# Patient Record
Sex: Male | Born: 1955 | Race: Black or African American | Hispanic: No | Marital: Married | Smoking: Former smoker
Health system: Southern US, Community
[De-identification: ages and names within clinical notes are randomized; demographics above are authoritative.]

## PROBLEM LIST (undated history)

## (undated) DIAGNOSIS — M940 Chondrocostal junction syndrome [Tietze]: Secondary | ICD-10-CM

## (undated) DIAGNOSIS — D126 Benign neoplasm of colon, unspecified: Secondary | ICD-10-CM

## (undated) DIAGNOSIS — K219 Gastro-esophageal reflux disease without esophagitis: Secondary | ICD-10-CM

## (undated) DIAGNOSIS — I10 Essential (primary) hypertension: Secondary | ICD-10-CM

## (undated) HISTORY — DX: Benign neoplasm of colon, unspecified: D12.6

## (undated) HISTORY — DX: Chondrocostal junction syndrome (tietze): M94.0

---
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
## 1998-06-23 DIAGNOSIS — I10 Essential (primary) hypertension: Secondary | ICD-10-CM | POA: Insufficient documentation

## 2003-06-24 HISTORY — PX: TOE SURGERY: SHX1073

## 2006-03-12 DIAGNOSIS — J301 Allergic rhinitis due to pollen: Secondary | ICD-10-CM | POA: Insufficient documentation

## 2006-11-11 ENCOUNTER — Ambulatory Visit: Payer: Self-pay | Admitting: Gastroenterology

## 2006-11-11 LAB — HM COLONOSCOPY: HM COLON: NORMAL

## 2009-05-29 ENCOUNTER — Ambulatory Visit: Payer: Self-pay | Admitting: Urology

## 2010-10-02 HISTORY — PX: OTHER SURGICAL HISTORY: SHX169

## 2011-04-11 DIAGNOSIS — E291 Testicular hypofunction: Secondary | ICD-10-CM | POA: Insufficient documentation

## 2012-05-27 ENCOUNTER — Ambulatory Visit: Payer: Self-pay | Admitting: Family Medicine

## 2012-07-26 ENCOUNTER — Other Ambulatory Visit: Payer: Self-pay | Admitting: Family Medicine

## 2012-07-26 ENCOUNTER — Ambulatory Visit: Payer: Self-pay | Admitting: Family Medicine

## 2012-07-26 LAB — CBC WITH DIFFERENTIAL/PLATELET
Basophil #: 0.1 x10 3/mm 3
Basophil %: 1 %
Eosinophil #: 0.4 x10 3/mm 3
Eosinophil %: 5.3 %
HCT: 47.2 %
HGB: 16.2 g/dL
Lymphocyte %: 24.4 %
Lymphs Abs: 2 x10 3/mm 3
MCH: 27.9 pg
MCHC: 34.4 g/dL
MCV: 81 fL
Monocyte #: 0.4 "x10 3/mm "
Monocyte %: 4.7 %
Neutrophil #: 5.4 x10 3/mm 3
Neutrophil %: 64.6 %
Platelet: 213 x10 3/mm 3
RBC: 5.81 x10 6/mm 3
RDW: 13.4 %
WBC: 8.3 x10 3/mm 3

## 2012-07-26 LAB — COMPREHENSIVE METABOLIC PANEL WITH GFR
Albumin: 4.2 g/dL
Alkaline Phosphatase: 105 U/L
Anion Gap: 3 — ABNORMAL LOW
BUN: 13 mg/dL
Bilirubin,Total: 0.4 mg/dL
Calcium, Total: 9.4 mg/dL
Chloride: 105 mmol/L
Co2: 31 mmol/L
Creatinine: 1.12 mg/dL
EGFR (African American): >60
EGFR (Non-African Amer.): >60
Glucose: 107 mg/dL — ABNORMAL HIGH
Osmolality: 278
Potassium: 4.1 mmol/L
SGOT(AST): 25 U/L
SGPT (ALT): 30 U/L
Sodium: 139 mmol/L
Total Protein: 8 g/dL

## 2012-07-26 LAB — TROPONIN I: Troponin-I: <0.02 ng/mL

## 2014-02-09 ENCOUNTER — Ambulatory Visit: Payer: Self-pay | Admitting: Family Medicine

## 2014-10-16 LAB — BASIC METABOLIC PANEL
BUN: 13 mg/dL (ref 4–21)
Creatinine: 1.2 mg/dL (ref 0.6–1.3)
Glucose: 103 mg/dL
Potassium: 4.4 mmol/L (ref 3.4–5.3)
Sodium: 140 mmol/L (ref 137–147)

## 2014-10-16 LAB — HEPATIC FUNCTION PANEL: ALT: 17 U/L (ref 10–40)

## 2014-10-16 LAB — LIPID PANEL
Cholesterol: 189 mg/dL (ref 0–200)
HDL: 50 mg/dL (ref 35–70)
LDL CALC: 112 mg/dL
Triglycerides: 135 mg/dL (ref 40–160)

## 2014-10-16 LAB — PSA: PSA: 0.9

## 2014-12-29 ENCOUNTER — Other Ambulatory Visit: Payer: Self-pay | Admitting: *Deleted

## 2014-12-29 MED ORDER — ROSUVASTATIN CALCIUM 10 MG PO TABS: 10.0000 mg | ORAL_TABLET | Freq: Every day | ORAL | Status: DC

## 2014-12-29 NOTE — Telephone Encounter (Signed)
Refill request for Crestor 10 mg Last filled by MD on- 01/02/2014 #30 x12 Last Appt: 10/16/2014 Next Appt: none Please advise refill?

## 2015-01-23 ENCOUNTER — Other Ambulatory Visit: Payer: Self-pay | Admitting: *Deleted

## 2015-01-23 MED ORDER — BENAZEPRIL-HYDROCHLOROTHIAZIDE 20-12.5 MG PO TABS: 1.0000 | ORAL_TABLET | Freq: Every day | ORAL | Status: DC

## 2015-01-23 NOTE — Telephone Encounter (Signed)
Refill request for Benazepril-HCTZ 20-12.5 mg Last filled by MD on- 12/26/2013 #30 x12 Last Appt: 10/16/2014 Next Appt: none Please advise refill?

## 2015-02-05 ENCOUNTER — Ambulatory Visit (INDEPENDENT_AMBULATORY_CARE_PROVIDER_SITE_OTHER): Payer: BC Managed Care – PPO | Admitting: Podiatry

## 2015-02-05 ENCOUNTER — Encounter: Payer: Self-pay | Admitting: Podiatry

## 2015-02-05 VITALS — BP 121/70 | HR 80 | Resp 16 | Ht 71.0 in | Wt 230.0 lb

## 2015-02-05 DIAGNOSIS — L600 Ingrowing nail: Secondary | ICD-10-CM | POA: Diagnosis not present

## 2015-02-05 MED ORDER — NEOMYCIN-POLYMYXIN-HC 3.5-10000-1 OT SOLN: OTIC | Status: DC

## 2015-02-05 NOTE — Patient Instructions (Signed)

## 2015-02-05 NOTE — Progress Notes (Signed)
   Subjective:    Patient ID: Keith Villegas, male    DOB: [birth year], 59 y.o.   MRN: [id number]  HPI: He presents today with a 2 week duration of a painful ingrown tibial border of the hallux right. He states that he has tried trimming the edges out himself but fails to completely remove the ingrown. He states that he's been also using peroxide to help with any infection. He states the pain is horrible with shoe gear and he must continue to work on a regular basis. He denies fever chills nausea vomiting muscle aches pains no constitutional signs of infection.  Review of Systems  All other systems reviewed and are negative.      Objective:   Physical Exam: I have reviewed his past medical history medications allergies social history and review of systems with chief complaint. Vital signs are stable he is alert and oriented 3 appears to be well-nourished and able to ambulate without assistance. Pulses are strongly palpable bilateral. Capillary fill time to digits 1 through 5 his bilateral. Neurologic sensorium is intact per Semmes-Weinstein monofilament. Deep tendon reflexes are intact and brisk bilateral. Muscle strength +5 over 5 dorsiflexion plantar flexors and inverters everters all intrinsic musculature is intact. Orthopedic evaluation demonstrates all joints distal to the ankle full range of motion without crepitation. Cutaneous evaluation demonstrates supple well-hydrated cutis with exception of a sharp incurvated nail margin to the tibial border of the hallux nail plate right foot. This does demonstrate erythema and edema purulence and malodor.        Assessment & Plan:  Assessment: 59 year old white male with a painful ingrown nail tibial border hallux right mildly complicated.  Plan: Discussed etiology pathology conservative versus surgical therapies. We discussed appropriate shoe gear stretching exercises ice therapy issue modifications. He was given both oral and written home-going  instructions for the care and soaking of his toe after surgery. Minor nail surgery was performed today after local anesthesia was administered. A chemical matrixectomy was performed of causing the tibial border of the nail from the toe exposing the nail bed and the matrix. 3 applications of phenol were applied. Silvadene cream and Telfa pad and a dry sterile compressive resting was applied. He was provided a prescription for Cortisporin otic which will be applied twice daily after soaking. This was sent to his pharmacy electronically. I will follow-up with him in 1 week.

## 2015-02-12 ENCOUNTER — Ambulatory Visit (INDEPENDENT_AMBULATORY_CARE_PROVIDER_SITE_OTHER): Payer: BC Managed Care – PPO | Admitting: Podiatry

## 2015-02-12 ENCOUNTER — Encounter: Payer: Self-pay | Admitting: Podiatry

## 2015-02-12 DIAGNOSIS — L600 Ingrowing nail: Secondary | ICD-10-CM

## 2015-02-12 NOTE — Progress Notes (Signed)
He presents today 1 week status post matrixectomy hallux right. He states that he continues to soak on a regular basis. He has no problems with. He denies fever chills nausea vomiting muscle aches and pains.  Objective: Vital signs are stable alert and oriented 3. Hallux nail right matrixectomy site appears to be healing very nicely uneventfully. No signs of infection.  Assessment: Well-healing surgical toe right.  Plan: Discontinue Betadine spell with Epsom salts and warm water soaks continue to separate during the day and leave open at night. Continue Cortisporin Otic twice daily.  Roselind Messier DPM

## 2015-02-12 NOTE — Patient Instructions (Signed)

## 2015-08-24 ENCOUNTER — Other Ambulatory Visit: Payer: Self-pay | Admitting: *Deleted

## 2015-08-24 MED ORDER — OMEPRAZOLE 40 MG PO CPDR: 40.0000 mg | DELAYED_RELEASE_CAPSULE | Freq: Every day | ORAL | Status: DC

## 2015-09-12 ENCOUNTER — Encounter: Payer: Self-pay | Admitting: Family Medicine

## 2015-09-12 ENCOUNTER — Ambulatory Visit (INDEPENDENT_AMBULATORY_CARE_PROVIDER_SITE_OTHER): Payer: BC Managed Care – PPO | Admitting: Family Medicine

## 2015-09-12 VITALS — BP 130/72 | HR 72 | Temp 98.3°F | Resp 16 | Ht 70.0 in | Wt 247.0 lb

## 2015-09-12 DIAGNOSIS — L918 Other hypertrophic disorders of the skin: Secondary | ICD-10-CM | POA: Insufficient documentation

## 2015-09-12 DIAGNOSIS — M940 Chondrocostal junction syndrome [Tietze]: Secondary | ICD-10-CM

## 2015-09-12 DIAGNOSIS — M545 Low back pain, unspecified: Secondary | ICD-10-CM | POA: Insufficient documentation

## 2015-09-12 DIAGNOSIS — E669 Obesity, unspecified: Secondary | ICD-10-CM | POA: Insufficient documentation

## 2015-09-12 DIAGNOSIS — K219 Gastro-esophageal reflux disease without esophagitis: Secondary | ICD-10-CM | POA: Insufficient documentation

## 2015-09-12 DIAGNOSIS — N451 Epididymitis: Secondary | ICD-10-CM

## 2015-09-12 DIAGNOSIS — N50812 Left testicular pain: Secondary | ICD-10-CM | POA: Diagnosis not present

## 2015-09-12 HISTORY — DX: Chondrocostal junction syndrome (tietze): M94.0

## 2015-09-12 MED ORDER — DOXYCYCLINE HYCLATE 100 MG PO TABS: 100.0000 mg | ORAL_TABLET | Freq: Two times a day (BID) | ORAL | Status: DC

## 2015-09-12 NOTE — Progress Notes (Signed)
Patient: Keith Villegas Male    DOB: [birth year]   60 y.o.   MRN: [id number] Visit Date: 09/12/2015  Today's Provider: Lelon Huh, MD   Chief Complaint  Patient presents with  . Groin Pain    x 2 weeks   Subjective:    Groin Pain The patient's primary symptoms include testicular pain. The patient's pertinent negatives include no genital injury, genital itching, genital lesions, penile pain or scrotal swelling. Primary symptoms comment: left inguinal pain. This is a new problem. Episode onset: 2 weeks ago. The problem occurs intermittently. The problem has been gradually worsening. Pain severity now: mediiun to severe. Associated symptoms include frequency (unchanged for several years; patient states he drinks lots of water). Pertinent negatives include no abdominal pain, anorexia, chest pain, chills, constipation, coughing, diarrhea, discolored urine, dysuria, fever, flank pain, headaches, hematuria, hesitancy, joint pain, joint swelling, nausea, rash, shortness of breath, sore throat, urgency, urinary retention or vomiting.  Patient is sometimes aggravated by walking. However it is always present even when at rest and lying in bed. He states it kept him last night and he had difficulty sleeping.  Pain sometimes radiates down to his left upper thigh.      No Known Allergies Previous Medications   ASPIRIN 81 MG TABLET    Take by mouth.   BENAZEPRIL-HYDROCHLORTHIAZIDE (LOTENSIN HCT) 20-12.5 MG PER TABLET    Take 1 tablet by mouth daily.   METOPROLOL SUCCINATE (TOPROL-XL) 50 MG 24 HR TABLET    Take by mouth.   NEOMYCIN-POLYMYXIN-HYDROCORTISONE (CORTISPORIN) OTIC SOLUTION    Apply one to two drops to toe after soaking twice daily.   OMEPRAZOLE (PRILOSEC) 40 MG CAPSULE    Take 1 capsule (40 mg total) by mouth daily.   ROSUVASTATIN (CRESTOR) 10 MG TABLET    Take 1 tablet (10 mg total) by mouth daily.   TRAMADOL (ULTRAM) 50 MG TABLET    Take by mouth.    Review of Systems    Constitutional: Negative for fever and chills.  HENT: Negative for sore throat.   Respiratory: Negative for cough and shortness of breath.   Cardiovascular: Negative for chest pain.  Gastrointestinal: Negative for nausea, vomiting, abdominal pain, diarrhea, constipation and anorexia.  Genitourinary: Positive for frequency (unchanged for several years; patient states he drinks lots of water) and testicular pain. Negative for dysuria, hesitancy, urgency, flank pain, scrotal swelling and penile pain.       Left inguinal pain  Musculoskeletal: Negative for joint pain.  Skin: Negative for rash.  Neurological: Negative for headaches.    Social History  Substance Use Topics  . Smoking status: Never Smoker   . Smokeless tobacco: Not on file  . Alcohol Use: No   Objective:   BP 130/72 mmHg  Pulse 72  Temp(Src) 98.3 F (36.8 C) (Oral)  Resp 16  Ht 5\' 10"  (1.778 m)  Wt 247 lb (112.038 kg)  BMI 35.44 kg/m2  SpO2 97%  Physical Exam  GU: Small bilateral inguinal hernias. Tender posterior aspect of left testicle. Slightly swollen. No other lesions.     Assessment & Plan:     1. Testicular pain, left He does have small inguinal hernias, but I think his current pain is due to epididymitis as below.   2. Epididymitis  - doxycycline (VIBRA-TABS) 100 MG tablet; Take 1 tablet (100 mg total) by mouth 2 (two) times daily.  Dispense: 28 tablet; Refill: 0  He is to call if pain worsens  or not significantly improved in 4-5 days.      Lelon Huh, MD  [facility]

## 2015-09-21 ENCOUNTER — Telehealth: Payer: Self-pay | Admitting: Family Medicine

## 2015-09-21 ENCOUNTER — Other Ambulatory Visit: Payer: Self-pay | Admitting: *Deleted

## 2015-09-21 DIAGNOSIS — N451 Epididymitis: Secondary | ICD-10-CM

## 2015-09-21 MED ORDER — METOPROLOL SUCCINATE ER 50 MG PO TB24: 50.0000 mg | ORAL_TABLET | Freq: Every day | ORAL | Status: DC

## 2015-09-21 MED ORDER — DOXYCYCLINE HYCLATE 100 MG PO TABS
100.0000 mg | ORAL_TABLET | Freq: Two times a day (BID) | ORAL | Status: AC
Start: 2015-09-21 — End: 2015-10-05

## 2015-09-21 NOTE — Telephone Encounter (Signed)
Patient called office with concerns because he had lost his prescription for Doxycyline 100mg  that was prescribed on 09/12/15. Patient is requesting that provider send in prescription for 14 days? Which he states is how many days he had left to take prescription. Please Advise.  [facility]

## 2015-09-21 NOTE — Telephone Encounter (Signed)
Please review. Thanks!  

## 2015-10-19 ENCOUNTER — Encounter: Payer: Self-pay | Admitting: Family Medicine

## 2015-10-19 ENCOUNTER — Ambulatory Visit (INDEPENDENT_AMBULATORY_CARE_PROVIDER_SITE_OTHER): Payer: BC Managed Care – PPO | Admitting: Family Medicine

## 2015-10-19 VITALS — BP 124/68 | HR 74 | Temp 97.5°F | Resp 16 | Ht 69.0 in | Wt 243.0 lb

## 2015-10-19 DIAGNOSIS — Z0000 Encounter for general adult medical examination without abnormal findings: Secondary | ICD-10-CM | POA: Diagnosis not present

## 2015-10-19 DIAGNOSIS — E785 Hyperlipidemia, unspecified: Secondary | ICD-10-CM | POA: Diagnosis not present

## 2015-10-19 DIAGNOSIS — I10 Essential (primary) hypertension: Secondary | ICD-10-CM | POA: Diagnosis not present

## 2015-10-19 NOTE — Progress Notes (Signed)
Patient ID: Keith Villegas, male   DOB: [birth year], 60 y.o.   MRN: [id number]       Patient: Keith Villegas, Male    DOB: 04/22/1956, 60 y.o.   MRN: [id number] Visit Date: 10/19/2015  Today's Provider: Lelon Huh, MD   Chief Complaint  Patient presents with  . Annual Exam   Subjective:    Annual physical exam Keith Villegas is a 60 y.o. male who presents today for health maintenance and complete physical. He feels fairly well. He reports he is not formally exercising but has an active job. He reports he is sleeping fairly well.  -----------------------------------------------------------------  Hypertension, follow-up:  BP Readings from Last 3 Encounters:  09/12/15 130/72  02/05/15 121/70    He was last seen for hypertension 1 years ago.  BP at that visit was 140/74. Management since that visit includes none. He reports fair compliance with treatment. He is not having side effects.  He is not exercising. He is adherent to low salt diet.   Outside blood pressures are not being checked in a while. He is experiencing none.  Patient denies chest pain, chest pressure/discomfort, claudication, dyspnea, exertional chest pressure/discomfort, fatigue, irregular heart beat, lower extremity edema, near-syncope, orthopnea and palpitations.   Cardiovascular risk factors include dyslipidemia, hypertension, male gender and obesity (BMI >= 30 kg/m2).   Wt Readings from Last 3 Encounters:  09/12/15 247 lb (112.038 kg)  02/05/15 230 lb (104.327 kg)   ------------------------------------------------------------------------    Lipid/Cholesterol, Follow-up:   Last seen for this1 years ago.  Management changes since that visit include none. . Last Lipid Panel:    Component Value Date/Time   CHOL 189 10/16/2014   TRIG 135 10/16/2014   HDL 50 10/16/2014   LDLCALC 112 10/16/2014    He reports good compliance with treatment. He is not having side effects.  Current symptoms include  none and have been unchanged. Weight trend: has lost 4 pounds since LOV.  Wt Readings from Last 3 Encounters:  09/12/15 247 lb (112.038 kg)  02/05/15 230 lb (104.327 kg)    -------------------------------------------------------------------   GERD, Follow up:  The patient was last seen for GERD 1 years ago. Changes made since that visit include none.  He reports good compliance with treatment. He is not having side effects.  He is NOT experiencing abdominal bloating, belching, cough, dysphagia, heartburn or nausea  ------------------------------------------------------------------------   Review of Systems  Constitutional: Negative.   HENT: Negative.   Eyes: Negative.   Respiratory: Negative.   Cardiovascular: Negative.   Gastrointestinal: Negative.   Endocrine: Negative.   Genitourinary: Negative.   Musculoskeletal: Positive for back pain.  Skin: Negative.   Allergic/Immunologic: Negative.   Neurological: Negative.   Hematological: Negative.   Psychiatric/Behavioral: Negative.     Social History      He  reports that he has never smoked. He does not have any smokeless tobacco history on file. He reports that he does not drink alcohol or use illicit drugs.       Social History   Social History  . Marital Status: Married    Spouse Name: N/A  . Number of Children: 2  . Years of Education: N/A   Occupational History  . Crew leader/ Housekeeping Tech     works at [employer]  . Smoking status: Never Smoker   . Smokeless tobacco: None  . Alcohol Use: No  . Drug Use: No  . Sexual Activity: Not Asked  Other Topics Concern  . None   Social History Narrative    Past Medical History  Diagnosis Date  . Costochondritis 09/12/2015     Patient Active Problem List   Diagnosis Date Noted  . Obesity 09/12/2015  . GERD (gastroesophageal reflux disease) 09/12/2015  . Hypogonadism, male 04/11/2011  . Allergic rhinitis due to pollen  03/12/2006  . Hyperlipidemia 06/23/1998  . Essential (primary) hypertension 06/23/1998    Past Surgical History  Procedure Laterality Date  . Toe surgery  2005  . Appendectomy  1976  . Echocardiogram  10/02/2010  . Myocardial perfusion scan  10/02/2010    normal perfusion. Normal systolic function. Borderline right ventricular dilation. EF>65%    Family History        Family Status  Relation Status Death Age  . Mother Alive   . Father Alive     78  . Sister Alive   . Brother Alive   . Brother Alive         His family history includes CAD in his father; Cancer in his mother.    No Known Allergies  Previous Medications   ASPIRIN 81 MG TABLET    Take 81 mg by mouth daily.    BENAZEPRIL-HYDROCHLORTHIAZIDE (LOTENSIN HCT) 20-12.5 MG PER TABLET    Take 1 tablet by mouth daily.   METOPROLOL SUCCINATE (TOPROL-XL) 50 MG 24 HR TABLET    Take 1 tablet (50 mg total) by mouth daily.   NEOMYCIN-POLYMYXIN-HYDROCORTISONE (CORTISPORIN) OTIC SOLUTION    Apply one to two drops to toe after soaking twice daily.   OMEPRAZOLE (PRILOSEC) 40 MG CAPSULE    Take 1 capsule (40 mg total) by mouth daily.   ROSUVASTATIN (CRESTOR) 10 MG TABLET    Take 1 tablet (10 mg total) by mouth daily.   TRAMADOL (ULTRAM) 50 MG TABLET    Take by mouth. 1-2 tablets every 6 hours as needed for pain    Patient Care Team: Birdie Sons, MD as PCP - General (Family Medicine) [place], DPM as Consulting Physician (Podiatry) Provider Not In System     Objective:   Vitals: BP 124/68 mmHg  Pulse 74  Temp(Src) 97.5 F (36.4 C) (Oral)  Resp 16  Ht 5\' 9"  (1.753 m)  Wt 243 lb (110.224 kg)  BMI 35.87 kg/m2   Physical Exam   General Appearance:    Alert, cooperative, no distress, appears stated age  Head:    Normocephalic, without obvious abnormality, atraumatic  Eyes:    PERRL, conjunctiva/corneas clear, EOM's intact, fundi    benign, both eyes       Ears:    Normal TM's and external ear canals, both ears    Nose:   Nares normal, septum midline, mucosa normal, no drainage   or sinus tenderness  Throat:   Lips, mucosa, and tongue normal; teeth and gums normal  Neck:   Supple, symmetrical, trachea midline, no adenopathy;       thyroid:  No enlargement/tenderness/nodules; no carotid   bruit or JVD  Back:     Symmetric, no curvature, ROM normal, no CVA tenderness  Lungs:     Clear to auscultation bilaterally, respirations unlabored  Chest wall:    No tenderness or deformity  Heart:    Regular rate and rhythm, S1 and S2 normal, no murmur, rub   or gallop  Abdomen:     Soft, non-tender, bowel sounds active all four quadrants,    no masses, no organomegaly  Genitalia:  deferred  Rectal:    deferred  Extremities:   Extremities normal, atraumatic, no cyanosis or edema  Pulses:   2+ and symmetric all extremities  Skin:   Skin color, texture, turgor normal, no rashes or lesions  Lymph nodes:   Cervical, supraclavicular, and axillary nodes normal  Neurologic:   CNII-XII intact. Normal strength, sensation and reflexes      throughout    Depression Screen PHQ 2/9 Scores 10/19/2015  PHQ - 2 Score 0      Assessment & Plan:     Routine Health Maintenance and Physical Exam  Exercise Activities and Dietary recommendations Goals    None      Immunization History  Administered Date(s) Administered  . Tdap 09/21/2007    Health Maintenance  Topic Date Due  . HIV Screening  11/08/1971  . INFLUENZA VACCINE  01/22/2016  . COLONOSCOPY  11/10/2016  . TETANUS/TDAP  09/20/2017  . Hepatitis C Screening  Completed      Discussed health benefits of physical activity, and encouraged him to engage in regular exercise appropriate for his age and condition.    --------------------------------------------------------------------  1. Annual physical exam  - Lipid panel - Comprehensive metabolic panel - PSA  2. Essential hypertension Well controlled.  Continue current medications.   - EKG  12-Lead  3. Hyperlipidemia He is tolerating rosuvastatin well with no adverse effects.   - Lipid panel

## 2015-10-20 LAB — LIPID PANEL
CHOL/HDL RATIO: 4.1 ratio (ref 0.0–5.0)
Cholesterol, Total: 190 mg/dL (ref 100–199)
HDL: 46 mg/dL (ref >39)
LDL Calculated: 105 mg/dL — ABNORMAL HIGH (ref 0–99)
TRIGLYCERIDES: 197 mg/dL — AB (ref 0–149)
VLDL Cholesterol Cal: 39 mg/dL (ref 5–40)

## 2015-10-20 LAB — COMPREHENSIVE METABOLIC PANEL
ALBUMIN: 4.6 g/dL (ref 3.5–5.5)
ALT: 21 IU/L (ref 0–44)
AST: 18 IU/L (ref 0–40)
Albumin/Globulin Ratio: 1.8 (ref 1.2–2.2)
Alkaline Phosphatase: 93 IU/L (ref 39–117)
BUN / CREAT RATIO: 11 (ref 9–20)
BUN: 12 mg/dL (ref 6–24)
Bilirubin Total: 0.7 mg/dL (ref 0.0–1.2)
CALCIUM: 9.7 mg/dL (ref 8.7–10.2)
CO2: 26 mmol/L (ref 18–29)
Chloride: 97 mmol/L (ref 96–106)
Creatinine, Ser: 1.08 mg/dL (ref 0.76–1.27)
GFR, EST AFRICAN AMERICAN: 87 mL/min/{1.73_m2} (ref >59)
GFR, EST NON AFRICAN AMERICAN: 75 mL/min/{1.73_m2} (ref >59)
GLUCOSE: 102 mg/dL — AB (ref 65–99)
Globulin, Total: 2.6 g/dL (ref 1.5–4.5)
Potassium: 4.1 mmol/L (ref 3.5–5.2)
Sodium: 139 mmol/L (ref 134–144)
TOTAL PROTEIN: 7.2 g/dL (ref 6.0–8.5)

## 2015-10-20 LAB — PSA: Prostate Specific Ag, Serum: 0.2 ng/mL (ref 0.0–4.0)

## 2015-10-22 ENCOUNTER — Telehealth: Payer: Self-pay | Admitting: Family Medicine

## 2015-10-22 NOTE — Telephone Encounter (Signed)
Pt called to get labs and EKG results. Pt stated his phone is messed up and will try to call back later. Thanks TNP

## 2015-10-22 NOTE — Telephone Encounter (Signed)
Noted. Sharyn Lull (Dr. Caryn Section CMA) has tried calling patient this morning. Will let her know that patient will call back.

## 2015-11-26 ENCOUNTER — Other Ambulatory Visit: Payer: Self-pay | Admitting: Family Medicine

## 2015-11-26 MED ORDER — METOPROLOL SUCCINATE ER 50 MG PO TB24: 50.0000 mg | ORAL_TABLET | Freq: Every day | ORAL | Status: DC

## 2015-12-21 ENCOUNTER — Other Ambulatory Visit: Payer: Self-pay | Admitting: *Deleted

## 2015-12-21 DIAGNOSIS — K219 Gastro-esophageal reflux disease without esophagitis: Secondary | ICD-10-CM

## 2015-12-21 MED ORDER — OMEPRAZOLE 40 MG PO CPDR: 40.0000 mg | DELAYED_RELEASE_CAPSULE | Freq: Every day | ORAL | Status: DC

## 2015-12-21 NOTE — Telephone Encounter (Signed)
Refill request for omeprazole 40 mg cap qd Last filled by MD on- 08/24/2015 #30 x3 Last Appt: 10/19/2015 Next Appt: none Please advise refill?

## 2015-12-22 MED ORDER — OMEPRAZOLE 40 MG PO CPDR: 40.0000 mg | DELAYED_RELEASE_CAPSULE | Freq: Every day | ORAL | Status: DC

## 2015-12-22 NOTE — Addendum Note (Signed)
Addended by: Mar Daring on: 12/22/2015 09:22 AM   Modules accepted: Orders

## 2016-01-24 ENCOUNTER — Ambulatory Visit (INDEPENDENT_AMBULATORY_CARE_PROVIDER_SITE_OTHER): Payer: BC Managed Care – PPO | Admitting: Family Medicine

## 2016-01-24 ENCOUNTER — Encounter: Payer: Self-pay | Admitting: Family Medicine

## 2016-01-24 VITALS — BP 120/80 | HR 88 | Temp 98.4°F | Resp 16 | Wt 240.4 lb

## 2016-01-24 DIAGNOSIS — J069 Acute upper respiratory infection, unspecified: Secondary | ICD-10-CM | POA: Diagnosis not present

## 2016-01-24 MED ORDER — HYDROCODONE-HOMATROPINE 5-1.5 MG/5ML PO SYRP
ORAL_SOLUTION | ORAL | 0 refills | Status: DC
Start: 2016-01-24 — End: 2016-10-21

## 2016-01-24 NOTE — Patient Instructions (Signed)
Discussed use of Mucinex D for congestion and Delsym for cough. 

## 2016-01-24 NOTE — Progress Notes (Signed)
Subjective:     Patient ID: Keith Villegas, male   DOB: Dec 29, 1955, 60 y.o.   MRN: [id number]  HPI  Chief Complaint  Patient presents with  . Cough    Patient comes in office today with concerns of cough and congestion for the past five days. Patient reports symptoms of; bilateral ear pain, sinus pain below eyes and sinus pressure over his entire head.   States he took Coricidin last night for his symptoms.   Review of Systems     Objective:   Physical Exam  Constitutional: He appears well-developed and well-nourished. No distress.  Ears: T.M's obscured by cerumen Throat: no tonsillar enlargement or exudate Neck: no cervical adenopathy Lungs: clear     Assessment:    1. Upper respiratory infection - HYDROcodone-homatropine (HYCODAN) 5-1.5 MG/5ML syrup; 5 ml 4-6 hours as needed for cough  Dispense: 240 mL; Refill: 0    Plan:    Discussed use of Mucinex D and Delsym.

## 2016-01-25 ENCOUNTER — Other Ambulatory Visit: Payer: Self-pay | Admitting: *Deleted

## 2016-01-25 MED ORDER — ROSUVASTATIN CALCIUM 10 MG PO TABS: 10.0000 mg | ORAL_TABLET | Freq: Every day | ORAL | 12 refills | Status: DC

## 2016-01-25 MED ORDER — BENAZEPRIL-HYDROCHLOROTHIAZIDE 20-12.5 MG PO TABS: 1.0000 | ORAL_TABLET | Freq: Every day | ORAL | 12 refills | Status: DC

## 2016-03-25 ENCOUNTER — Other Ambulatory Visit: Payer: Self-pay

## 2016-03-25 MED ORDER — METOPROLOL SUCCINATE ER 50 MG PO TB24: 50.0000 mg | ORAL_TABLET | Freq: Every day | ORAL | 5 refills | Status: DC

## 2016-03-25 NOTE — Telephone Encounter (Signed)
Pharmacy request refill. 

## 2016-04-25 ENCOUNTER — Other Ambulatory Visit: Payer: Self-pay | Admitting: Physician Assistant

## 2016-04-25 DIAGNOSIS — K219 Gastro-esophageal reflux disease without esophagitis: Secondary | ICD-10-CM

## 2016-04-25 NOTE — Telephone Encounter (Signed)
Please review-aa 

## 2016-04-25 NOTE — Telephone Encounter (Signed)
Pharmacy request for omeprazole refill

## 2016-04-26 ENCOUNTER — Other Ambulatory Visit: Payer: Self-pay | Admitting: Family Medicine

## 2016-04-26 DIAGNOSIS — K219 Gastro-esophageal reflux disease without esophagitis: Secondary | ICD-10-CM

## 2016-04-26 MED ORDER — OMEPRAZOLE 40 MG PO CPDR: 40.0000 mg | DELAYED_RELEASE_CAPSULE | Freq: Every day | ORAL | 6 refills | Status: DC

## 2016-04-29 ENCOUNTER — Ambulatory Visit (INDEPENDENT_AMBULATORY_CARE_PROVIDER_SITE_OTHER): Payer: BC Managed Care – PPO | Admitting: Podiatry

## 2016-04-29 ENCOUNTER — Encounter: Payer: Self-pay | Admitting: Podiatry

## 2016-04-29 ENCOUNTER — Ambulatory Visit (INDEPENDENT_AMBULATORY_CARE_PROVIDER_SITE_OTHER): Payer: BC Managed Care – PPO

## 2016-04-29 VITALS — BP 149/88 | HR 73

## 2016-04-29 DIAGNOSIS — S90222A Contusion of left lesser toe(s) with damage to nail, initial encounter: Secondary | ICD-10-CM | POA: Diagnosis not present

## 2016-04-29 DIAGNOSIS — R52 Pain, unspecified: Secondary | ICD-10-CM | POA: Diagnosis not present

## 2016-04-29 DIAGNOSIS — M722 Plantar fascial fibromatosis: Secondary | ICD-10-CM | POA: Diagnosis not present

## 2016-04-29 DIAGNOSIS — M79672 Pain in left foot: Secondary | ICD-10-CM

## 2016-04-29 MED ORDER — MELOXICAM 15 MG PO TABS: 15.0000 mg | ORAL_TABLET | Freq: Every day | ORAL | 1 refills | Status: AC

## 2016-04-29 MED ORDER — BETAMETHASONE SOD PHOS & ACET 6 (3-3) MG/ML IJ SUSP: 3.0000 mg | Freq: Once | INTRAMUSCULAR | Status: DC

## 2016-04-29 NOTE — Progress Notes (Signed)
Subjective: Patient presents today for pain and tenderness in the left foot. Patient states the foot pain has been hurting for several weeks now. Patient states that it hurts in the mornings with the first steps out of bed. Patient also states that one month ago he dropped a table on his left great toenail. Patient noticed severe bleeding and tenderness for the past month.  Patient presents today for further treatment and evaluation  Objective: Physical Exam General: The patient is alert and oriented x3 in no acute distress.  Dermatology: Darkening with partial detachment of the left great toenail is noted. It appears that a new nail is beginning to grow from the nail matrix. Skin is warm, dry and supple bilateral lower extremities. Negative for open lesions or macerations bilateral.   Vascular: Dorsalis Pedis and Posterior Tibial pulses palpable bilateral.  Capillary fill time is immediate to all digits.  Neurological: Epicritic and protective threshold intact bilateral.   Musculoskeletal: Tenderness to palpation at the medial calcaneal tubercale and through the insertion of the plantar fascia of the left foot. All other joints range of motion within normal limits bilateral. Strength 5/5 in all groups bilateral.   Radiographic exam:   Normal osseous mineralization. Joint spaces preserved. No fracture/dislocation/boney destruction. Calcaneal spur present with mild thickening of plantar fascia left. No other soft tissue abnormalities or radiopaque foreign bodies.   Assessment: 1. Plantar fasciitis left foot 2. Pain in left foot 3. Subungual hematoma left great toenail secondary to trauma 4. Partial detachment left great toenail secondary to trauma  Plan of Care:   1. Patient evaluated. Xrays reviewed.   2. Injection of 0.5cc Celestone soluspan injected into the left plantar fascia.  3. Instructed patient regarding therapies and modalities at home to alleviate symptoms.  4. Rx for  meloxicam given to patient.  5. Rx for anti-inflammatory pain cream dispensed through [facility] [facility] [employer].  6. Plantar fascial band(s) dispensed. 7. Today the patient was scanned for custom molded orthotics 8. Total temporary nail avulsion was performed to the left great toenail after local anesthesia infiltration consisting of a 50-50 mixture of 2% lidocaine plain with 0.5% Marcaine plain totaling 2.5 mL 9. Light dressing applied. 10. Return to clinic in 4 weeks.

## 2016-04-29 NOTE — Patient Instructions (Signed)

## 2016-05-20 ENCOUNTER — Ambulatory Visit (INDEPENDENT_AMBULATORY_CARE_PROVIDER_SITE_OTHER): Payer: BC Managed Care – PPO | Admitting: Podiatry

## 2016-05-20 DIAGNOSIS — R52 Pain, unspecified: Secondary | ICD-10-CM | POA: Diagnosis not present

## 2016-05-20 DIAGNOSIS — M79672 Pain in left foot: Secondary | ICD-10-CM

## 2016-05-20 DIAGNOSIS — M722 Plantar fascial fibromatosis: Secondary | ICD-10-CM | POA: Diagnosis not present

## 2016-05-20 DIAGNOSIS — S90222A Contusion of left lesser toe(s) with damage to nail, initial encounter: Secondary | ICD-10-CM

## 2016-05-20 NOTE — Patient Instructions (Signed)

## 2016-05-20 NOTE — Progress Notes (Signed)
Subjective:  Patient presents today for follow-up evaluation of a temporary nail avulsion to the left great toes was plantar fasciitis to the left foot. Patient states he is doing much better notices significant improvement to his left heel pain. Patient also has no complaints to the temporary total nail avulsion.    Objective/Physical Exam General: The patient is alert and oriented x3 in no acute distress.  Dermatology: There is no growth of the nail plate noted to the left great toe covering approximately 30% of the nailbed. Skin is warm, dry and supple bilateral lower extremities. Negative for open lesions or macerations.  Vascular: Palpable pedal pulses bilaterally. No edema or erythema noted. Capillary refill within normal limits.  Neurological: Epicritic and protective threshold grossly intact bilaterally.   Musculoskeletal Exam: Negative for pain on palpation to the posterior medial aspect of the patient's left heel at the insertion of the plantar fascia to the medial calcaneal tubercle. Range of motion within normal limits to all pedal and ankle joints bilateral. Muscle strength 5/5 in all groups bilateral.   Assessment: #1 plantar fasciitis left foot-healed #2 status post temporary total nail avulsion left great toe   Plan of Care:  #1 Patient was evaluated. #2 custom molded orthotics were dispensed today. #3 continue meloxicam and plantar fascial band as needed. #4 continue conservative modalities and therapies at home to alleviate symptoms #5 return to clinic when necessary   Dr. Edrick Kins, [place]

## 2016-05-26 ENCOUNTER — Encounter: Payer: Self-pay | Admitting: *Deleted

## 2016-06-18 ENCOUNTER — Other Ambulatory Visit: Payer: Self-pay | Admitting: Family Medicine

## 2016-06-18 DIAGNOSIS — K649 Unspecified hemorrhoids: Secondary | ICD-10-CM

## 2016-06-18 MED ORDER — HYDROCORTISONE ACE-PRAMOXINE 2.5-1 % RE CREA: 1.0000 "application " | TOPICAL_CREAM | Freq: Three times a day (TID) | RECTAL | 1 refills | Status: DC

## 2016-06-18 NOTE — Progress Notes (Signed)
Received fax request from [facility] for refill hydrocortisone/prom 2.5-1 originally written 09/30/2012

## 2016-09-16 ENCOUNTER — Other Ambulatory Visit: Payer: Self-pay | Admitting: *Deleted

## 2016-09-16 MED ORDER — METOPROLOL SUCCINATE ER 50 MG PO TB24: 50.0000 mg | ORAL_TABLET | Freq: Every day | ORAL | 5 refills | Status: DC

## 2016-10-21 ENCOUNTER — Ambulatory Visit (INDEPENDENT_AMBULATORY_CARE_PROVIDER_SITE_OTHER): Payer: BC Managed Care – PPO | Admitting: Family Medicine

## 2016-10-21 ENCOUNTER — Encounter: Payer: Self-pay | Admitting: Family Medicine

## 2016-10-21 VITALS — BP 130/70 | HR 65 | Temp 98.1°F | Resp 16 | Ht 68.75 in | Wt 240.0 lb

## 2016-10-21 DIAGNOSIS — I10 Essential (primary) hypertension: Secondary | ICD-10-CM | POA: Diagnosis not present

## 2016-10-21 DIAGNOSIS — Z0000 Encounter for general adult medical examination without abnormal findings: Secondary | ICD-10-CM | POA: Diagnosis not present

## 2016-10-21 DIAGNOSIS — E785 Hyperlipidemia, unspecified: Secondary | ICD-10-CM

## 2016-10-21 DIAGNOSIS — Z125 Encounter for screening for malignant neoplasm of prostate: Secondary | ICD-10-CM

## 2016-10-21 DIAGNOSIS — Z1211 Encounter for screening for malignant neoplasm of colon: Secondary | ICD-10-CM | POA: Diagnosis not present

## 2016-10-21 NOTE — Progress Notes (Signed)
Patient: Keith Villegas, Male    DOB: 12/07/1955, 61 y.o.   MRN: [id number] Visit Date: 10/21/2016  Today's Provider: Lelon Huh, MD   Chief Complaint  Patient presents with  . Annual Exam  . Hypertension    follow up  . Hyperlipidemia    follow up   Subjective:    Annual physical exam Keith Villegas is a 61 y.o. male who presents today for health maintenance and complete physical. He feels fairly well. He reports never exercising. He reports he is sleeping fairly well. His last colonoscopy was by Dr. Allen Norris in 2008.   -----------------------------------------------------------------  Hypertension, follow-up:  BP Readings from Last 3 Encounters:  04/29/16 (!) 149/88  01/24/16 120/80  10/19/15 124/68    He was last seen for hypertension 1 years ago.  BP at that visit was 124/68. Management since that visit includes no changes. He reports good compliance with treatment. He is not having side effects.  He is not exercising. He is adherent to low salt diet.   Outside blood pressures are not being checked. He is experiencing none.  Patient denies chest pain, chest pressure/discomfort, claudication, dyspnea, exertional chest pressure/discomfort, fatigue, irregular heart beat, lower extremity edema, near-syncope, orthopnea, palpitations, paroxysmal nocturnal dyspnea, syncope and tachypnea.   Cardiovascular risk factors include advanced age (older than 41 for men, 39 for women), dyslipidemia, hypertension and male gender.  Use of agents associated with hypertension: NSAIDS.     Weight trend: stable Wt Readings from Last 3 Encounters:  01/24/16 240 lb 6.4 oz (109 kg)  10/19/15 243 lb (110.2 kg)  09/12/15 247 lb (112 kg)    Current diet: well balanced  ------------------------------------------------------------------------  Lipid/Cholesterol, Follow-up:   Last seen for this1 years ago.  Management changes since that visit include none. . Last Lipid Panel:   Component Value Date/Time   CHOL 190 10/19/2015 1017   TRIG 197 (H) 10/19/2015 1017   HDL 46 10/19/2015 1017   CHOLHDL 4.1 10/19/2015 1017   LDLCALC 105 (H) 10/19/2015 1017    Risk factors for vascular disease include hypercholesterolemia and hypertension  He reports good compliance with treatment. He is not having side effects.  Current symptoms include none and have been stable. Weight trend: stable Prior visit with dietician: no Current diet: well balanced Current exercise: none  Wt Readings from Last 3 Encounters:  01/24/16 240 lb 6.4 oz (109 kg)  10/19/15 243 lb (110.2 kg)  09/12/15 247 lb (112 kg)    -------------------------------------------------------------------   Review of Systems  Constitutional: Negative for appetite change, chills, fatigue and fever.  HENT: Positive for rhinorrhea and sinus pressure. Negative for congestion, ear pain, hearing loss, nosebleeds and trouble swallowing.   Eyes: Negative for pain and visual disturbance.  Respiratory: Positive for cough. Negative for chest tightness and shortness of breath.   Cardiovascular: Negative for chest pain, palpitations and leg swelling.  Gastrointestinal: Negative for abdominal pain, blood in stool, constipation, diarrhea, nausea and vomiting.  Endocrine: Negative for polydipsia, polyphagia and polyuria.  Genitourinary: Negative for dysuria and flank pain.  Musculoskeletal: Positive for back pain. Negative for arthralgias, joint swelling, myalgias and neck stiffness.  Skin: Negative for color change, rash and wound.  Neurological: Positive for headaches. Negative for dizziness, tremors, seizures, speech difficulty, weakness and light-headedness.  Psychiatric/Behavioral: Negative for behavioral problems, confusion, decreased concentration, dysphoric mood and sleep disturbance. The patient is not nervous/anxious.   All other systems reviewed and are negative.   Social  History      He  reports that he has  never smoked. He does not have any smokeless tobacco history on file. He reports that he does not drink alcohol or use drugs.       Social History   Social History  . Marital status: Married    Spouse name: N/A  . Number of children: 2  . Years of education: N/A   Occupational History  . Crew leader/ Housekeeping Tech     works at [employer]  . Smoking status: Never Smoker  . Smokeless tobacco: Never Used  . Alcohol use No  . Drug use: No  . Sexual activity: Not Asked   Other Topics Concern  . None   Social History Narrative  . None    Past Medical History:  Diagnosis Date  . Costochondritis 09/12/2015     Patient Active Problem List   Diagnosis Date Noted  . Hemorrhoids 06/18/2016  . Obesity 09/12/2015  . GERD (gastroesophageal reflux disease) 09/12/2015  . Hypogonadism, male 04/11/2011  . Allergic rhinitis due to pollen 03/12/2006  . Hyperlipidemia 06/23/1998  . Essential (primary) hypertension 06/23/1998    Past Surgical History:  Procedure Laterality Date  . APPENDECTOMY  1976  . echocardiogram  10/02/2010  . Myocardial perfusion scan  10/02/2010   normal perfusion. Normal systolic function. Borderline right ventricular dilation. EF>65%  . TOE SURGERY  2005    Family History        Family Status  Relation Status  . Mother Alive  . Father Alive   66  . Sister Alive  . Brother Alive  . Brother Alive        His family history includes CAD in his father; Cancer in his mother.     No Known Allergies   Current Outpatient Prescriptions:  .  aspirin 81 MG tablet, Take 81 mg by mouth daily. , Disp: , Rfl:  .  benazepril-hydrochlorthiazide (LOTENSIN HCT) 20-12.5 MG tablet, Take 1 tablet by mouth daily., Disp: 30 tablet, Rfl: 12 .  hydrocortisone-pramoxine (ANALPRAM HC) 2.5-1 % rectal cream, Place 1 application rectally 3 (three) times daily., Disp: 30 g, Rfl: 1 .  metoprolol succinate (TOPROL-XL) 50 MG 24 hr tablet, Take 1  tablet (50 mg total) by mouth daily., Disp: 30 tablet, Rfl: 5 .  omeprazole (PRILOSEC) 40 MG capsule, Take 1 capsule (40 mg total) by mouth daily., Disp: 30 capsule, Rfl: 6 .  rosuvastatin (CRESTOR) 10 MG tablet, Take 1 tablet (10 mg total) by mouth daily., Disp: 30 tablet, Rfl: 12  Current Facility-Administered Medications:  .  betamethasone acetate-betamethasone sodium phosphate (CELESTONE) injection 3 mg, 3 mg, Intramuscular, Once, Edrick Kins, DPM   Patient Care Team: Birdie Sons, MD as PCP - General (Family Medicine) [place], DPM as Consulting Physician (Podiatry) Provider Not In System      Objective:   Vitals: BP 130/70 (BP Location: Left Arm, Patient Position: Sitting, Cuff Size: Large)   Pulse 65   Temp 98.1 F (36.7 C) (Oral)   Resp 16   Ht 5' 8.75" (1.746 m)   Wt 240 lb (108.9 kg)   SpO2 97% Comment: room air  BMI 35.70 kg/m   There were no vitals filed for this visit.   Physical Exam   General Appearance:    Alert, cooperative, no distress, appears stated age, obese  Head:    Normocephalic, without obvious abnormality, atraumatic  Eyes:  PERRL, conjunctiva/corneas clear, EOM's intact, fundi    benign, both eyes       Ears:    Normal TM's and external ear canals, both ears  Nose:   Nares normal, septum midline, mucosa normal, no drainage   or sinus tenderness  Throat:   Lips, mucosa, and tongue normal; teeth and gums normal  Neck:   Supple, symmetrical, trachea midline, no adenopathy;       thyroid:  No enlargement/tenderness/nodules; no carotid   bruit or JVD  Back:     Symmetric, no curvature, ROM normal, no CVA tenderness  Lungs:     Clear to auscultation bilaterally, respirations unlabored  Chest wall:    No tenderness or deformity  Heart:    Regular rate and rhythm, S1 and S2 normal, no murmur, rub   or gallop  Abdomen:     Soft, non-tender, bowel sounds active all four quadrants,    no masses, no organomegaly  Genitalia:    deferred    Rectal:    deferred  Extremities:   Extremities normal, atraumatic, no cyanosis or edema  Pulses:   2+ and symmetric all extremities  Skin:   Skin color, texture, turgor normal, no rashes or lesions  Lymph nodes:   Cervical, supraclavicular, and axillary nodes normal  Neurologic:   CNII-XII intact. Normal strength, sensation and reflexes      throughout    Depression Screen PHQ 2/9 Scores 10/21/2016 10/19/2015  PHQ - 2 Score 1 0  PHQ- 9 Score 2 -      Assessment & Plan:     Routine Health Maintenance and Physical Exam  Exercise Activities and Dietary recommendations Goals    None      Immunization History  Administered Date(s) Administered  . Tdap 09/21/2007    Health Maintenance  Topic Date Due  . HIV Screening  11/08/1971  . COLONOSCOPY  11/10/2016  . INFLUENZA VACCINE  01/21/2017  . TETANUS/TDAP  09/20/2017  . Hepatitis C Screening  Completed     Discussed health benefits of physical activity, and encouraged him to engage in regular exercise appropriate for his age and condition.    --------------------------------------------------------------------  1. Annual physical exam Need to work on losing weight, he plans on starting regular exercise program and eating healthier.  - Comprehensive metabolic panel - Lipid panel  2. Hyperlipidemia, unspecified hyperlipidemia type He is tolerating rosuvastatin well with no adverse effects.   - Comprehensive metabolic panel - Lipid panel  3. Essential (primary) hypertension Well controlled.  Continue current medications.   - Comprehensive metabolic panel - Lipid panel  4. Prostate cancer screening  - PSA  5. Colon cancer screening Has been 10 years since last colonoscopy which was done by Dr. Allen Norris.  - Ambulatory referral to Gastroenterology  Lelon Huh, MD  [facility]

## 2016-10-22 LAB — COMPREHENSIVE METABOLIC PANEL
ALT: 19 IU/L (ref 0–44)
AST: 22 IU/L (ref 0–40)
Albumin/Globulin Ratio: 1.8 (ref 1.2–2.2)
Albumin: 4.6 g/dL (ref 3.5–5.5)
Alkaline Phosphatase: 99 IU/L (ref 39–117)
BILIRUBIN TOTAL: 0.5 mg/dL (ref 0.0–1.2)
BUN/Creatinine Ratio: 12 (ref 9–20)
BUN: 14 mg/dL (ref 6–24)
CHLORIDE: 98 mmol/L (ref 96–106)
CO2: 27 mmol/L (ref 18–29)
Calcium: 9.7 mg/dL (ref 8.7–10.2)
Creatinine, Ser: 1.16 mg/dL (ref 0.76–1.27)
GFR calc Af Amer: 79 mL/min/{1.73_m2} (ref >59)
GFR calc non Af Amer: 69 mL/min/{1.73_m2} (ref >59)
Globulin, Total: 2.6 g/dL (ref 1.5–4.5)
Glucose: 104 mg/dL — ABNORMAL HIGH (ref 65–99)
Potassium: 4.3 mmol/L (ref 3.5–5.2)
Sodium: 140 mmol/L (ref 134–144)
TOTAL PROTEIN: 7.2 g/dL (ref 6.0–8.5)

## 2016-10-22 LAB — LIPID PANEL
CHOL/HDL RATIO: 3.8 ratio (ref 0.0–5.0)
CHOLESTEROL TOTAL: 164 mg/dL (ref 100–199)
HDL: 43 mg/dL (ref >39)
LDL Calculated: 93 mg/dL (ref 0–99)
TRIGLYCERIDES: 141 mg/dL (ref 0–149)
VLDL Cholesterol Cal: 28 mg/dL (ref 5–40)

## 2016-10-22 LAB — PSA: PROSTATE SPECIFIC AG, SERUM: 0.1 ng/mL (ref 0.0–4.0)

## 2016-10-31 ENCOUNTER — Other Ambulatory Visit: Payer: Self-pay

## 2016-10-31 ENCOUNTER — Telehealth: Payer: Self-pay

## 2016-10-31 DIAGNOSIS — Z1211 Encounter for screening for malignant neoplasm of colon: Secondary | ICD-10-CM

## 2016-10-31 NOTE — Telephone Encounter (Signed)
Gastroenterology Pre-Procedure Review  Request Date: Nov 20, 2016 Demetrius Charity) Requesting Physician: Dr. Vicente Males  PATIENT REVIEW QUESTIONS: The patient responded to the following health history questions as indicated:    1. Are you having any GI issues? no 2. Do you have a personal history of Polyps? no 3. Do you have a family history of Colon Cancer or Polyps? no 4. Diabetes Mellitus? no 5. Joint replacements in the past 12 months?no 6. Major health problems in the past 3 months?no 7. Any artificial heart valves, MVP, or defibrillator?no    MEDICATIONS & ALLERGIES:    Patient reports the following regarding taking any anticoagulation/antiplatelet therapy:   Plavix, Coumadin, Eliquis, Xarelto, Lovenox, Pradaxa, Brilinta, or Effient? no Aspirin? yes (81 mg daily)  Patient confirms/reports the following medications:  Current Outpatient Prescriptions  Medication Sig Dispense Refill  . aspirin 81 MG tablet Take 81 mg by mouth daily.     . benazepril-hydrochlorthiazide (LOTENSIN HCT) 20-12.5 MG tablet Take 1 tablet by mouth daily. 30 tablet 12  . hydrocortisone-pramoxine (ANALPRAM HC) 2.5-1 % rectal cream Place 1 application rectally 3 (three) times daily. 30 g 1  . metoprolol succinate (TOPROL-XL) 50 MG 24 hr tablet Take 1 tablet (50 mg total) by mouth daily. 30 tablet 5  . omeprazole (PRILOSEC) 40 MG capsule Take 1 capsule (40 mg total) by mouth daily. 30 capsule 6  . rosuvastatin (CRESTOR) 10 MG tablet Take 1 tablet (10 mg total) by mouth daily. 30 tablet 12   Current Facility-Administered Medications  Medication Dose Route Frequency Provider Last Rate Last Dose  . betamethasone acetate-betamethasone sodium phosphate (CELESTONE) injection 3 mg  3 mg Intramuscular Once Edrick Kins, DPM        Patient confirms/reports the following allergies:  No Known Allergies  No orders of the defined types were placed in this encounter.   AUTHORIZATION INFORMATION Primary Insurance: 1D#: Group  #:  Secondary Insurance: 1D#: Group #:  SCHEDULE INFORMATION: Date: May 31st 2018 Time: Location:[facility]

## 2016-11-03 ENCOUNTER — Telehealth: Payer: Self-pay | Admitting: Gastroenterology

## 2016-11-03 NOTE — Telephone Encounter (Signed)
11/03/16 Spoke with Angelita at [facility] and NO prior auth is required for Screening Colonoscopy [phone] /  Z12.11.

## 2016-11-20 ENCOUNTER — Ambulatory Visit: Payer: BC Managed Care – PPO | Admitting: Certified Registered"

## 2016-11-20 ENCOUNTER — Encounter
Admission: RE | Disposition: A | Discharge status: Home / Self Care | Payer: Self-pay | Source: Ambulatory Visit | Attending: Gastroenterology

## 2016-11-20 ENCOUNTER — Ambulatory Visit
Admission: RE | Admit: 2016-11-20 | Discharge: 2016-11-20 | Disposition: A | Discharge status: Home / Self Care | Payer: BC Managed Care – PPO | Source: Ambulatory Visit | Attending: Gastroenterology | Admitting: Gastroenterology

## 2016-11-20 DIAGNOSIS — I10 Essential (primary) hypertension: Secondary | ICD-10-CM | POA: Diagnosis not present

## 2016-11-20 DIAGNOSIS — Z1211 Encounter for screening for malignant neoplasm of colon: Secondary | ICD-10-CM | POA: Diagnosis not present

## 2016-11-20 DIAGNOSIS — Z87891 Personal history of nicotine dependence: Secondary | ICD-10-CM | POA: Insufficient documentation

## 2016-11-20 DIAGNOSIS — K219 Gastro-esophageal reflux disease without esophagitis: Secondary | ICD-10-CM | POA: Diagnosis not present

## 2016-11-20 DIAGNOSIS — K621 Rectal polyp: Secondary | ICD-10-CM | POA: Diagnosis not present

## 2016-11-20 DIAGNOSIS — K640 First degree hemorrhoids: Secondary | ICD-10-CM

## 2016-11-20 DIAGNOSIS — Z7982 Long term (current) use of aspirin: Secondary | ICD-10-CM | POA: Insufficient documentation

## 2016-11-20 DIAGNOSIS — Z79899 Other long term (current) drug therapy: Secondary | ICD-10-CM | POA: Insufficient documentation

## 2016-11-20 DIAGNOSIS — D128 Benign neoplasm of rectum: Secondary | ICD-10-CM | POA: Insufficient documentation

## 2016-11-20 HISTORY — DX: Gastro-esophageal reflux disease without esophagitis: K21.9

## 2016-11-20 HISTORY — PX: COLONOSCOPY WITH PROPOFOL: SHX5780

## 2016-11-20 HISTORY — DX: Essential (primary) hypertension: I10

## 2016-11-20 SURGERY — COLONOSCOPY WITH PROPOFOL
Anesthesia: General

## 2016-11-20 MED ORDER — LIDOCAINE 2% (20 MG/ML) 5 ML SYRINGE
INTRAMUSCULAR | Status: DC | PRN
  Administered 2016-11-20: 25 mg via INTRAVENOUS

## 2016-11-20 MED ORDER — LIDOCAINE HCL (PF) 2 % IJ SOLN
INTRAMUSCULAR | Status: AC
  Filled 2016-11-20: qty 2

## 2016-11-20 MED ORDER — PROPOFOL 500 MG/50ML IV EMUL
INTRAVENOUS | Status: AC
  Filled 2016-11-20: qty 50

## 2016-11-20 MED ORDER — GLYCOPYRROLATE 0.2 MG/ML IJ SOLN
INTRAMUSCULAR | Status: AC
  Filled 2016-11-20: qty 1

## 2016-11-20 MED ORDER — PROPOFOL 500 MG/50ML IV EMUL
INTRAVENOUS | Status: DC | PRN
  Administered 2016-11-20: 120 ug/kg/min via INTRAVENOUS

## 2016-11-20 MED ORDER — SODIUM CHLORIDE 0.9 % IV SOLN
INTRAVENOUS | Status: DC
  Administered 2016-11-20: 08:00:00 via INTRAVENOUS

## 2016-11-20 MED ORDER — PROPOFOL 10 MG/ML IV BOLUS
INTRAVENOUS | Status: DC | PRN
  Administered 2016-11-20: 70 mg via INTRAVENOUS
  Administered 2016-11-20: 30 mg via INTRAVENOUS

## 2016-11-20 NOTE — Anesthesia Postprocedure Evaluation (Signed)
Anesthesia Post Note  Patient: Keith Villegas  Procedure(s) Performed: Procedure(s) (LRB): COLONOSCOPY WITH PROPOFOL (N/A)  Patient location during evaluation: Endoscopy Anesthesia Type: General Level of consciousness: awake and alert Pain management: pain level controlled Vital Signs Assessment: post-procedure vital signs reviewed and stable Respiratory status: spontaneous breathing and respiratory function stable Cardiovascular status: stable Anesthetic complications: no     Last Vitals:  Vitals:   11/20/16 0900 11/20/16 0910  BP: 105/73 (!) 141/83  Pulse: 62 (!) 58  Resp: 12 17  Temp:      Last Pain:  Vitals:   11/20/16 0801  TempSrc: Tympanic                 KEPHART,WILLIAM K

## 2016-11-20 NOTE — Anesthesia Post-op Follow-up Note (Cosign Needed)
Anesthesia QCDR form completed.        

## 2016-11-20 NOTE — Transfer of Care (Signed)
Immediate Anesthesia Transfer of Care Note  Patient: Keith Villegas  Procedure(s) Performed: Procedure(s): COLONOSCOPY WITH PROPOFOL (N/A)  Patient Location: Endoscopy Unit  Anesthesia Type:General  Level of Consciousness: awake  Airway & Oxygen Therapy: Patient Spontanous Breathing  Post-op Assessment: Report given to RN and Post -op Vital signs reviewed and stable  Post vital signs: Reviewed  Last Vitals:  Vitals:   11/20/16 0801 11/20/16 0847  BP: 135/82 111/64  Pulse: 69 75  Resp: 20 19  Temp: (!) 36.1 C (!) 35.9 C    Last Pain:  Vitals:   11/20/16 0801  TempSrc: Tympanic         Complications: No apparent anesthesia complications

## 2016-11-20 NOTE — Anesthesia Preprocedure Evaluation (Signed)
Anesthesia Evaluation  Patient identified by MRN, date of birth, ID band Patient awake    Reviewed: Allergy & Precautions, NPO status , Patient's Chart, lab work & pertinent test results, reviewed documented beta blocker date and time   History of Anesthesia Complications Negative for: history of anesthetic complications  Airway Mallampati: II       Dental   Pulmonary neg pulmonary ROS,           Cardiovascular hypertension, Pt. on medications and Pt. on home beta blockers      Neuro/Psych negative neurological ROS     GI/Hepatic Neg liver ROS, GERD  Medicated and Controlled,  Endo/Other  negative endocrine ROS  Renal/GU negative Renal ROS     Musculoskeletal   Abdominal   Peds  Hematology negative hematology ROS (+)   Anesthesia Other Findings   Reproductive/Obstetrics                             Anesthesia Physical Anesthesia Plan  ASA: II  Anesthesia Plan: General   Post-op Pain Management:    Induction: Intravenous  Airway Management Planned: Nasal Cannula  Additional Equipment:   Intra-op Plan:   Post-operative Plan:   Informed Consent: I have reviewed the patients History and Physical, chart, labs and discussed the procedure including the risks, benefits and alternatives for the proposed anesthesia with the patient or authorized representative who has indicated his/her understanding and acceptance.     Plan Discussed with:   Anesthesia Plan Comments:         Anesthesia Quick Evaluation

## 2016-11-20 NOTE — H&P (Signed)
Jonathon Bellows MD [address], [place], [place] [address] Phone: [phone] Fax : [phone]  Primary Care Physician:  Birdie Sons, MD Primary Gastroenterologist:  Dr. Jonathon Bellows   Pre-Procedure History & Physical: HPI:  Keith Villegas is a 61 y.o. male is here for an colonoscopy.   Past Medical History:  Diagnosis Date  . Costochondritis 09/12/2015  . GERD (gastroesophageal reflux disease)   . Hypertension     Past Surgical History:  Procedure Laterality Date  . APPENDECTOMY  1976  . echocardiogram  10/02/2010  . Myocardial perfusion scan  10/02/2010   normal perfusion. Normal systolic function. Borderline right ventricular dilation. EF>65%  . TOE SURGERY  2005    Prior to Admission medications   Medication Sig Start Date End Date Taking? Authorizing Provider  benazepril-hydrochlorthiazide (LOTENSIN HCT) 20-12.5 MG tablet Take 1 tablet by mouth daily. 01/25/16  Yes Birdie Sons, MD  metoprolol succinate (TOPROL-XL) 50 MG 24 hr tablet Take 1 tablet (50 mg total) by mouth daily. 09/16/16  Yes Birdie Sons, MD  Multiple Vitamin (MULTIVITAMIN) capsule Take 1 capsule by mouth daily.   Yes [provider]  omeprazole (PRILOSEC) 40 MG capsule Take 1 capsule (40 mg total) by mouth daily. 04/26/16  Yes Birdie Sons, MD  rosuvastatin (CRESTOR) 10 MG tablet Take 1 tablet (10 mg total) by mouth daily. 01/25/16  Yes Birdie Sons, MD  aspirin 81 MG tablet Take 81 mg by mouth daily.  03/27/08   [provider]  hydrocortisone-pramoxine (ANALPRAM HC) 2.5-1 % rectal cream Place 1 application rectally 3 (three) times daily. 06/18/16   Birdie Sons, MD    Allergies as of 10/31/2016  . (No Known Allergies)    Family History  Problem Relation Age of Onset  . Cancer Mother        Dementia & Schizophrenia  . CAD Father     Social History   Social History  . Marital status: Married    Spouse name: N/A  . Number of children: 2  . Years of  education: N/A   Occupational History  . Crew leader/ Housekeeping Tech     works at [employer]  . Smoking status: Former Research scientist (life sciences)  . Smokeless tobacco: Never Used  . Alcohol use No  . Drug use: No  . Sexual activity: Not on file   Other Topics Concern  . Not on file   Social History Narrative  . No narrative on file    Review of Systems: See HPI, otherwise negative ROS  Physical Exam: BP 135/82   Pulse 69   Temp (!) 96.9 F (36.1 C) (Tympanic)   Resp 20   Ht 5\' 8"  (1.727 m)   Wt 237 lb (107.5 kg)   SpO2 100%   BMI 36.04 kg/m  General:   Alert,  pleasant and cooperative in NAD Head:  Normocephalic and atraumatic. Neck:  Supple; no masses or thyromegaly. Lungs:  Clear throughout to auscultation.    Heart:  Regular rate and rhythm. Abdomen:  Soft, nontender and nondistended. Normal bowel sounds, without guarding, and without rebound.   Neurologic:  Alert and  oriented x4;  grossly normal neurologically.  Impression/Plan: Keith Villegas is here for an colonoscopy to be performed for Screening colonoscopy average risk    Risks, benefits, limitations, and alternatives regarding  colonoscopy have been reviewed with the patient.  Questions have been answered.  All parties agreeable.  Jonathon Bellows, MD  11/20/2016, 8:08 AM

## 2016-11-20 NOTE — Op Note (Signed)
[facility] Gastroenterology Patient Name: Keith Villegas Procedure Date: 11/20/2016 8:14 AM MRN: [id number] Account #: [id number] Date of Birth: May 27, 1956 Admit Type: Outpatient Age: 61 Room: [facility] ENDO ROOM 4 Gender: Male Note Status: Finalized Procedure:            Colonoscopy Indications:          Screening for colorectal malignant neoplasm Providers:            Jonathon Bellows MD, MD Referring MD:         Kirstie Peri. Caryn Section, MD (Referring MD) Medicines:            Monitored Anesthesia Care Complications:        No immediate complications. Procedure:            Pre-Anesthesia Assessment:                       - Prior to the procedure, a History and Physical was                        performed, and patient medications, allergies and                        sensitivities were reviewed. The patient's tolerance of                        previous anesthesia was reviewed.                       - The risks and benefits of the procedure and the                        sedation options and risks were discussed with the                        patient. All questions were answered and informed                        consent was obtained.                       - ASA Grade Assessment: II - A patient with mild                        systemic disease.                       - After reviewing the risks and benefits, the patient                        was deemed in satisfactory condition to undergo the                        procedure.                       After obtaining informed consent, the colonoscope was                        passed under direct vision. Throughout the procedure,  the patient's blood pressure, pulse, and oxygen                        saturations were monitored continuously. The                        Colonoscope was introduced through the anus and                        advanced to the the cecum, identified by the   appendiceal orifice, IC valve and transillumination.                        The colonoscopy was performed with ease. The patient                        tolerated the procedure well. The quality of the bowel                        preparation was good. Findings:      The perianal and digital rectal examinations were normal.      Non-bleeding internal hemorrhoids were found during retroflexion. The       hemorrhoids were medium-sized and Grade I (internal hemorrhoids that do       not prolapse).      A 6 mm polyp was found in the rectum. The polyp was sessile. The polyp       was removed with a cold snare. Resection and retrieval were complete.      The exam was otherwise without abnormality on direct and retroflexion       views. Impression:           - Non-bleeding internal hemorrhoids.                       - One 6 mm polyp in the rectum, removed with a cold                        snare. Resected and retrieved.                       - The examination was otherwise normal on direct and                        retroflexion views. Recommendation:       - Discharge patient to home (with escort).                       - Resume previous diet.                       - Continue present medications.                       - Await pathology results.                       - Repeat colonoscopy in 5-10 years for surveillance                        based on pathology results. Procedure Code(s):    --- Professional ---  45385, Colonoscopy, flexible; with removal of tumor(s),                        polyp(s), or other lesion(s) by snare technique Diagnosis Code(s):    --- Professional ---                       Z12.11, Encounter for screening for malignant neoplasm                        of colon                       K62.1, Rectal polyp                       K64.0, First degree hemorrhoids CPT copyright 2016 American Medical Association. All rights reserved. The codes documented in  this report are preliminary and upon coder review may  be revised to meet current compliance requirements. Jonathon Bellows, MD Jonathon Bellows MD, MD 11/20/2016 8:44:35 AM This report has been signed electronically. Number of Addenda: 0 Note Initiated On: 11/20/2016 8:14 AM Scope Withdrawal Time: 0 hours 12 minutes 21 seconds  Total Procedure Duration: 0 hours 16 minutes 21 seconds       [facility]

## 2016-11-21 ENCOUNTER — Encounter: Payer: Self-pay | Admitting: Gastroenterology

## 2016-11-21 ENCOUNTER — Encounter: Payer: Self-pay | Admitting: Family Medicine

## 2016-11-21 DIAGNOSIS — Z86010 Personal history of colonic polyps: Secondary | ICD-10-CM | POA: Insufficient documentation

## 2016-11-21 LAB — SURGICAL PATHOLOGY

## 2016-11-24 ENCOUNTER — Other Ambulatory Visit: Payer: Self-pay

## 2016-11-24 DIAGNOSIS — K219 Gastro-esophageal reflux disease without esophagitis: Secondary | ICD-10-CM

## 2016-11-24 MED ORDER — OMEPRAZOLE 40 MG PO CPDR: 40.0000 mg | DELAYED_RELEASE_CAPSULE | Freq: Every day | ORAL | 12 refills | Status: DC

## 2016-11-24 NOTE — Telephone Encounter (Signed)
Refill request from [facility]

## 2016-11-26 ENCOUNTER — Encounter: Payer: Self-pay | Admitting: Gastroenterology

## 2016-12-05 ENCOUNTER — Other Ambulatory Visit: Payer: Self-pay

## 2017-02-19 ENCOUNTER — Other Ambulatory Visit: Payer: Self-pay | Admitting: Family Medicine

## 2017-04-13 ENCOUNTER — Ambulatory Visit (INDEPENDENT_AMBULATORY_CARE_PROVIDER_SITE_OTHER): Payer: BC Managed Care – PPO | Admitting: Family Medicine

## 2017-04-13 ENCOUNTER — Ambulatory Visit
Admission: RE | Admit: 2017-04-13 | Discharge: 2017-04-13 | Disposition: A | Discharge status: Home / Self Care | Payer: BC Managed Care – PPO | Source: Ambulatory Visit | Attending: Family Medicine | Admitting: Family Medicine

## 2017-04-13 ENCOUNTER — Encounter: Payer: Self-pay | Admitting: Family Medicine

## 2017-04-13 VITALS — BP 138/86 | HR 73 | Temp 98.2°F | Wt 247.0 lb

## 2017-04-13 DIAGNOSIS — M545 Low back pain, unspecified: Secondary | ICD-10-CM

## 2017-04-13 DIAGNOSIS — M47896 Other spondylosis, lumbar region: Secondary | ICD-10-CM | POA: Insufficient documentation

## 2017-04-13 MED ORDER — METHOCARBAMOL 750 MG PO TABS: 750.0000 mg | ORAL_TABLET | Freq: Four times a day (QID) | ORAL | 1 refills | Status: DC

## 2017-04-13 NOTE — Patient Instructions (Signed)
Low Back Strain A strain is a stretch or tear in a muscle or the strong cords of tissue that attach muscle to bone (tendons). Strains of the lower back (lumbar spine) are a common cause of low back pain. A strain occurs when muscles or tendons are torn or are stretched beyond their limits. The muscles may become inflamed, resulting in pain and sudden muscle tightening (spasms). A strain can happen suddenly due to an injury (trauma), or it can develop gradually due to overuse. There are three types of strains:  Grade 1 is a mild strain involving a minor tear of the muscle fibers or tendons. This may cause some pain but no loss of muscle strength.  Grade 2 is a moderate strain involving a partial tear of the muscle fibers or tendons. This causes more severe pain and some loss of muscle strength.  Grade 3 is a severe strain involving a complete tear of the muscle or tendon. This causes severe pain and complete or nearly complete loss of muscle strength. What are the causes? This condition may be caused by:  Trauma, such as a fall or a hit to the body.  Twisting or overstretching the back. This may result from doing activities that require a lot of energy, such as lifting heavy objects. What increases the risk? The following factors may increase your risk of getting this condition:  Playing contact sports.  Participating in sports or activities that put excessive stress on the back and require a lot of bending and twisting, including:  Lifting weights or heavy objects.  Gymnastics.  Soccer.  Figure skating.  Snowboarding.  Being overweight or obese.  Having poor strength and flexibility. What are the signs or symptoms? Symptoms of this condition may include:  Sharp or dull pain in the lower back that does not go away. Pain may extend to the buttocks.  Stiffness.  Limited range of motion.  Inability to stand up straight due to stiffness or pain.  Muscle spasms. How is this  diagnosed?   This condition may be diagnosed based on:  Your symptoms.  Your medical history.  A physical exam.  Your health care provider may push on certain areas of your back to determine the source of your pain.  You may be asked to bend forward, backward, and side to side to assess the severity of your pain and your range of motion.  Imaging tests, such as:  X-rays.  MRI. How is this treated? Treatment for this condition may include:  Applying heat and cold to the affected area.  Medicines to help relieve pain and to relax your muscles (muscle relaxants).  NSAIDs to help reduce swelling and discomfort.  Physical therapy. When your symptoms improve, it is important to gradually return to your normal routine as soon as possible to reduce pain, avoid stiffness, and avoid loss of muscle strength. Generally, symptoms should improve within 6 weeks of treatment. However, recovery time varies. Follow these instructions at home: Managing pain, stiffness, and swelling  If directed, apply ice to the injured area during the first 24 hours after your injury.  Put ice in a plastic bag.  Place a towel between your skin and the bag.  Leave the ice on for 20 minutes, 2-3 times a day.  If directed, apply heat to the affected area as often as told by your health care provider. Use the heat source that your health care provider recommends, such as a moist heat pack or a heating pad.    or a heating pad. ? Place a towel between your skin and the heat source. ? Leave the heat on for 20-30 minutes. ? Remove the heat if your skin turns bright red. This is especially important if you are unable to feel pain, heat, or cold. You may have a greater risk of getting burned. Activity  Rest and return to your normal activities as told by your health care provider. Ask your health care provider what activities are safe for you.  Avoid activities that take a lot of effort (are strenuous) for as long as told  by your health care provider.  Do exercises as told by your health care provider. General instructions   Take over-the-counter and prescription medicines only as told by your health care provider.  If you have questions or concerns about safety while taking pain medicine, talk with your health care provider.  Do not drive or operate heavy machinery until you know how your pain medicine affects you.  Do not use any tobacco products, such as cigarettes, chewing tobacco, and e-cigarettes. Tobacco can delay bone healing. If you need help quitting, ask your health care provider.  Keep all follow-up visits as told by your health care provider. This is important. How is this prevented?  Warm up and stretch before being active.  Cool down and stretch after being active.  Give your body time to rest between periods of activity.  Avoid: ? Being physically inactive for long periods at a time. ? Exercising or playing sports when you are tired or in pain.  Use correct form when playing sports and lifting heavy objects.  Use good posture when sitting and standing.  Maintain a healthy weight.  Sleep on a mattress with medium firmness to support your back.  Make sure to use equipment that fits you, including shoes that fit well.  Be safe and responsible while being active to avoid falls.  Do at least 150 minutes of moderate-intensity exercise each week, such as brisk walking or water aerobics. Try a form of exercise that takes stress off your back, such as swimming or stationary cycling.  Maintain physical fitness, including: ? Strength. ? Flexibility. ? Cardiovascular fitness. ? Endurance. Contact a health care provider if:  Your back pain does not improve after 6 weeks of treatment.  Your symptoms get worse. Get help right away if:  Your back pain is severe.  You are unable to stand or walk.  You develop pain in your legs.  You develop weakness in your buttocks or  legs.  You have difficulty controlling when you urinate or when you have a bowel movement. This information is not intended to replace advice given to you by your health care provider. Make sure you discuss any questions you have with your health care provider. Document Released: 06/09/2005 Document Revised: 02/14/2016 Document Reviewed: 03/21/2015 Elsevier Interactive Patient Education  2017 [place]. Back Exercises The following exercises strengthen the muscles that help to support the back. They also help to keep the lower back flexible. Doing these exercises can help to prevent back pain or lessen existing pain. If you have back pain or discomfort, try doing these exercises 2-3 times each day or as told by your health care provider. When the pain goes away, do them once each day, but increase the number of times that you repeat the steps for each exercise (do more repetitions). If you do not have back pain or discomfort, do these exercises once each day or as  told by your health care provider. Exercises Single Knee to Chest  Repeat these steps 3-5 times for each leg: 1. Lie on your back on a firm bed or the floor with your legs extended. 2. Bring one knee to your chest. Your other leg should stay extended and in contact with the floor. 3. Hold your knee in place by grabbing your knee or thigh. 4. Pull on your knee until you feel a gentle stretch in your lower back. 5. Hold the stretch for 10-30 seconds. 6. Slowly release and straighten your leg.  Pelvic Tilt  Repeat these steps 5-10 times: 1. Lie on your back on a firm bed or the floor with your legs extended. 2. Bend your knees so they are pointing toward the ceiling and your feet are flat on the floor. 3. Tighten your lower abdominal muscles to press your lower back against the floor. This motion will tilt your pelvis so your tailbone points up toward the ceiling instead of pointing to your feet or the floor. 4. With gentle  tension and even breathing, hold this position for 5-10 seconds.  Cat-Cow  Repeat these steps until your lower back becomes more flexible: 1. Get into a hands-and-knees position on a firm surface. Keep your hands under your shoulders, and keep your knees under your hips. You may place padding under your knees for comfort. 2. Let your head hang down, and point your tailbone toward the floor so your lower back becomes rounded like the back of a cat. 3. Hold this position for 5 seconds. 4. Slowly lift your head and point your tailbone up toward the ceiling so your back forms a sagging arch like the back of a cow. 5. Hold this position for 5 seconds.  Press-Ups  Repeat these steps 5-10 times: 1. Lie on your abdomen (face-down) on the floor. 2. Place your palms near your head, about shoulder-width apart. 3. While you keep your back as relaxed as possible and keep your hips on the floor, slowly straighten your arms to raise the top half of your body and lift your shoulders. Do not use your back muscles to raise your upper torso. You may adjust the placement of your hands to make yourself more comfortable. 4. Hold this position for 5 seconds while you keep your back relaxed. 5. Slowly return to lying flat on the floor.  Bridges  Repeat these steps 10 times: 1. Lie on your back on a firm surface. 2. Bend your knees so they are pointing toward the ceiling and your feet are flat on the floor. 3. Tighten your buttocks muscles and lift your buttocks off of the floor until your waist is at almost the same height as your knees. You should feel the muscles working in your buttocks and the back of your thighs. If you do not feel these muscles, slide your feet 1-2 inches farther away from your buttocks. 4. Hold this position for 3-5 seconds. 5. Slowly lower your hips to the starting position, and allow your buttocks muscles to relax completely.  If this exercise is too easy, try doing it with your arms  crossed over your chest. Abdominal Crunches  Repeat these steps 5-10 times: 1. Lie on your back on a firm bed or the floor with your legs extended. 2. Bend your knees so they are pointing toward the ceiling and your feet are flat on the floor. 3. Cross your arms over your chest. 4. Tip your chin slightly toward your chest  without bending your neck. 5. Tighten your abdominal muscles and slowly raise your trunk (torso) high enough to lift your shoulder blades a tiny bit off of the floor. Avoid raising your torso higher than that, because it can put too much stress on your low back and it does not help to strengthen your abdominal muscles. 6. Slowly return to your starting position.  Back Lifts Repeat these steps 5-10 times: 1. Lie on your abdomen (face-down) with your arms at your sides, and rest your forehead on the floor. 2. Tighten the muscles in your legs and your buttocks. 3. Slowly lift your chest off of the floor while you keep your hips pressed to the floor. Keep the back of your head in line with the curve in your back. Your eyes should be looking at the floor. 4. Hold this position for 3-5 seconds. 5. Slowly return to your starting position.  Contact a health care provider if:  Your back pain or discomfort gets much worse when you do an exercise.  Your back pain or discomfort does not lessen within 2 hours after you exercise. If you have any of these problems, stop doing these exercises right away. Do not do them again unless your health care provider says that you can. Get help right away if:  You develop sudden, severe back pain. If this happens, stop doing the exercises right away. Do not do them again unless your health care provider says that you can. This information is not intended to replace advice given to you by your health care provider. Make sure you discuss any questions you have with your health care provider. Document Released: 07/17/2004 Document Revised:  10/17/2015 Document Reviewed: 08/03/2014 Elsevier Interactive Patient Education  2017 [employer].

## 2017-04-13 NOTE — Progress Notes (Signed)
Patient: Keith Villegas Male    DOB: Dec 09, 1955   61 y.o.   MRN: [id number] Visit Date: 04/13/2017  Today's Provider: Vernie Murders, PA   Chief Complaint  Patient presents with  . Back Pain   Subjective:    Back Pain  This is a new problem. Episode onset: 10 days ago. The problem occurs constantly. The problem has been gradually worsening since onset. The pain is present in the lumbar spine. The quality of the pain is described as aching. The pain radiates to the right thigh. The symptoms are aggravated by sitting and standing. Treatments tried: BioFreeze, heating pad, ice, and stretching exercise  The treatment provided no relief.   Past Medical History:  Diagnosis Date  . Costochondritis 09/12/2015  . GERD (gastroesophageal reflux disease)   . Hypertension    Past Surgical History:  Procedure Laterality Date  . APPENDECTOMY  1976  . COLONOSCOPY WITH PROPOFOL N/A 11/20/2016   Procedure: COLONOSCOPY WITH PROPOFOL;  Surgeon: Jonathon Bellows, MD;  Location: [facility] ENDOSCOPY;  Service: Endoscopy;  Laterality: N/A;  . echocardiogram  10/02/2010  . Myocardial perfusion scan  10/02/2010   normal perfusion. Normal systolic function. Borderline right ventricular dilation. EF>65%  . TOE SURGERY  2005   Family History  Problem Relation Age of Onset  . Cancer Mother        Dementia & Schizophrenia  . CAD Father    No Known Allergies   Previous Medications   ASPIRIN 81 MG TABLET    Take 81 mg by mouth daily.    ASPIRIN-CALCIUM CARBONATE 81-777 MG TABS    Take by mouth.   BENAZEPRIL-HYDROCHLORTHIAZIDE (LOTENSIN HCT) 20-12.5 MG TABLET    TAKE ONE TABLET BY MOUTH EVERY DAY   HYDROCORTISONE-PRAMOXINE (ANALPRAM HC) 2.5-1 % RECTAL CREAM    Place 1 application rectally 3 (three) times daily.   METOPROLOL SUCCINATE (TOPROL-XL) 50 MG 24 HR TABLET    Take 1 tablet (50 mg total) by mouth daily.   MULTIPLE VITAMIN (MULTIVITAMIN) CAPSULE    Take 1 capsule by mouth daily.   OMEPRAZOLE (PRILOSEC) 40 MG  CAPSULE    Take 1 capsule (40 mg total) by mouth daily.   ROSUVASTATIN (CRESTOR) 10 MG TABLET    TAKE ONE TABLET BY MOUTH EVERY DAY    Review of Systems  Constitutional: Negative.   Respiratory: Negative.   Cardiovascular: Negative.   Musculoskeletal: Positive for back pain.    Social History  Substance Use Topics  . Smoking status: Former Research scientist (life sciences)  . Smokeless tobacco: Never Used  . Alcohol use No   Objective:   BP 138/86 (BP Location: Right Arm, Patient Position: Sitting, Cuff Size: Normal)   Pulse 73   Temp 98.2 F (36.8 C) (Oral)   Wt 247 lb (112 kg)   SpO2 97%   BMI 37.56 kg/m   Physical Exam  Constitutional: He is oriented to person, place, and time. He appears well-developed and well-nourished. No distress.  HENT:  Head: Normocephalic and atraumatic.  Right Ear: Hearing normal.  Left Ear: Hearing normal.  Nose: Nose normal.  Eyes: Conjunctivae and lids are normal. Right eye exhibits no discharge. Left eye exhibits no discharge. No scleral icterus.  Pulmonary/Chest: Effort normal. No respiratory distress.  Musculoskeletal:  Fair ROM with sharp spasms with bending or twisting spine. Tightness in right lower lumbar musculature with pain to transition from sitting to standing. Some ache radiation into the right inner thigh to the patella.   Neurological: He is alert  and oriented to person, place, and time.  Skin: Skin is intact. No lesion and no rash noted.  Psychiatric: He has a normal mood and affect. His speech is normal and behavior is normal. Thought content normal.      Assessment & Plan:     1. Recurrent low back pain Has had back pains intermittently working at [employer] and around his home. Felt a pop in the lower back on 03-27-17 fueling his generator and continues to have sharp spasms the past couple days. Recommend he stay out of work for 5 days and use Robaxin with Ibuprofen (600 mg TID with food). Given rehab exercises for stretching and apply moist heat or ice  packs prn. May use Salonpas Lidocaine Patches prn for pain. Recheck x-ray to compare with films of 2015. Follow up pending reports. - methocarbamol (ROBAXIN) 750 MG tablet; Take 1 tablet (750 mg total) by mouth 4 (four) times daily.  Dispense: 50 tablet; Refill: 1 - DG Lumbar Spine Complete

## 2017-04-21 ENCOUNTER — Ambulatory Visit (INDEPENDENT_AMBULATORY_CARE_PROVIDER_SITE_OTHER): Payer: BC Managed Care – PPO | Admitting: Family Medicine

## 2017-04-21 ENCOUNTER — Encounter: Payer: Self-pay | Admitting: Family Medicine

## 2017-04-21 VITALS — BP 110/70 | HR 72 | Temp 97.9°F | Resp 16 | Wt 249.0 lb

## 2017-04-21 DIAGNOSIS — M549 Dorsalgia, unspecified: Secondary | ICD-10-CM

## 2017-04-21 DIAGNOSIS — G8929 Other chronic pain: Secondary | ICD-10-CM

## 2017-04-21 NOTE — Progress Notes (Signed)
Patient: Keith Villegas Male    DOB: Mar 15, 1956   61 y.o.   MRN: [id number] Visit Date: 04/21/2017  Today's Provider: Lelon Huh, MD   Chief Complaint  Patient presents with  . Back Pain   Subjective:    HPI  Recurrent low back pain From 04/13/2017-(seen by Vernie Murders). Recommend he stay out of work for 5 days and use Robaxin with Ibuprofen (600 mg TID with food). Given rehab exercises for stretching and apply moist heat or ice packs prn. May use Salonpas Lidocaine Patches prn for pain. Recheck x-ray to compare with films of 2015. X-Ray showed degenerative disease and arthritis.  Was radiating into right leg.    No Known Allergies   Current Outpatient Prescriptions:  .  aspirin 81 MG tablet, Take 81 mg by mouth daily. , Disp: , Rfl:  .  Aspirin-Calcium Carbonate 81-777 MG TABS, Take by mouth., Disp: , Rfl:  .  benazepril-hydrochlorthiazide (LOTENSIN HCT) 20-12.5 MG tablet, TAKE ONE TABLET BY MOUTH EVERY DAY, Disp: 30 tablet, Rfl: 10 .  methocarbamol (ROBAXIN) 750 MG tablet, Take 1 tablet (750 mg total) by mouth 4 (four) times daily., Disp: 50 tablet, Rfl: 1 .  metoprolol succinate (TOPROL-XL) 50 MG 24 hr tablet, Take 1 tablet (50 mg total) by mouth daily., Disp: 30 tablet, Rfl: 5 .  Multiple Vitamin (MULTIVITAMIN) capsule, Take 1 capsule by mouth daily., Disp: , Rfl:  .  omeprazole (PRILOSEC) 40 MG capsule, Take 1 capsule (40 mg total) by mouth daily., Disp: 30 capsule, Rfl: 12 .  rosuvastatin (CRESTOR) 10 MG tablet, TAKE ONE TABLET BY MOUTH EVERY DAY, Disp: 30 tablet, Rfl: 10 .  hydrocortisone-pramoxine (ANALPRAM HC) 2.5-1 % rectal cream, Place 1 application rectally 3 (three) times daily. (Patient not taking: Reported on 04/13/2017), Disp: 30 g, Rfl: 1  Current Facility-Administered Medications:  .  betamethasone acetate-betamethasone sodium phosphate (CELESTONE) injection 3 mg, 3 mg, Intramuscular, Once, Edrick Kins, DPM  Review of Systems  Constitutional:  Negative for appetite change, chills and fever.  Respiratory: Negative for chest tightness, shortness of breath and wheezing.   Cardiovascular: Negative for chest pain and palpitations.  Gastrointestinal: Negative for abdominal pain, nausea and vomiting.  Musculoskeletal: Positive for back pain.    Social History  Substance Use Topics  . Smoking status: Former Research scientist (life sciences)  . Smokeless tobacco: Never Used  . Alcohol use No   Objective:   BP 110/70 (BP Location: Right Arm, Patient Position: Sitting, Cuff Size: Large)   Pulse 72   Temp 97.9 F (36.6 C)   Resp 16   Wt 249 lb (112.9 kg)   BMI 37.86 kg/m  Vitals:   04/21/17 1616  BP: 110/70  Pulse: 72  Resp: 16  Temp: 97.9 F (36.6 C)  Weight: 249 lb (112.9 kg)     Physical Exam  General appearance: alert, well developed, well nourished, cooperative and in no distress Head: Normocephalic, without obvious abnormality, atraumatic Respiratory: Respirations even and unlabored, normal respiratory rate Extremities: No gross deformities Skin: Skin color, texture, turgor normal. No rashes seen  Psych: Appropriate mood and affect. Neurologic: Mental status: Alert, oriented to person, place, and time, thought content appropriate.     Assessment & Plan:     1. Chronic back pain, unspecified back location, unspecified back pain laterality Current exacerbation is improving, but concerned about chronicity due to DDD. He is interested in getting on back program.  - Ambulatory referral to Physical Therapy  Lelon Huh, MD  [facility]

## 2017-04-22 ENCOUNTER — Other Ambulatory Visit: Payer: Self-pay | Admitting: Family Medicine

## 2017-06-04 ENCOUNTER — Other Ambulatory Visit: Payer: Self-pay | Admitting: Family Medicine

## 2017-06-04 DIAGNOSIS — M545 Low back pain, unspecified: Secondary | ICD-10-CM

## 2017-08-11 ENCOUNTER — Ambulatory Visit (INDEPENDENT_AMBULATORY_CARE_PROVIDER_SITE_OTHER): Payer: BC Managed Care – PPO

## 2017-08-11 ENCOUNTER — Encounter: Payer: Self-pay | Admitting: Podiatry

## 2017-08-11 ENCOUNTER — Ambulatory Visit: Payer: BC Managed Care – PPO | Admitting: Podiatry

## 2017-08-11 DIAGNOSIS — M722 Plantar fascial fibromatosis: Secondary | ICD-10-CM | POA: Diagnosis not present

## 2017-08-11 MED ORDER — METHYLPREDNISOLONE 4 MG PO TBPK
ORAL_TABLET | ORAL | 0 refills | Status: DC
Start: 2017-08-11 — End: 2018-08-02

## 2017-08-11 MED ORDER — MELOXICAM 15 MG PO TABS: 15.0000 mg | ORAL_TABLET | Freq: Every day | ORAL | 1 refills | Status: AC

## 2017-08-11 NOTE — Progress Notes (Signed)
Dg  

## 2017-08-12 ENCOUNTER — Ambulatory Visit (INDEPENDENT_AMBULATORY_CARE_PROVIDER_SITE_OTHER): Payer: BC Managed Care – PPO | Admitting: Orthotics

## 2017-08-12 DIAGNOSIS — M722 Plantar fascial fibromatosis: Secondary | ICD-10-CM | POA: Diagnosis not present

## 2017-08-12 DIAGNOSIS — M79672 Pain in left foot: Secondary | ICD-10-CM

## 2017-08-12 NOTE — Progress Notes (Signed)
Patient seen today for eval/casting for heel pain (L); Patient complains of pain center of calcaneous R (plantar).  Plan on [employer] semi rigid device to provide longitudinal arch support.  Also bilateral heel punch plus horseshoe cushioning.

## 2017-08-13 NOTE — Progress Notes (Signed)
   Subjective: Patient presents today for a flare up of plantar fasciitis of the left heel that began 2-3 weeks ago. He reports sharp, shooting pain. He states that it hurts worse after sitting for long periods of time. He has not done anything to treat the symptoms. Patient presents today for further treatment and evaluation.  Past Medical History:  Diagnosis Date  . Costochondritis 09/12/2015  . GERD (gastroesophageal reflux disease)   . Hypertension      Objective: Physical Exam General: The patient is alert and oriented x3 in no acute distress.  Dermatology: Skin is warm, dry and supple bilateral lower extremities. Negative for open lesions or macerations bilateral.   Vascular: Dorsalis Pedis and Posterior Tibial pulses palpable bilateral.  Capillary fill time is immediate to all digits.  Neurological: Epicritic and protective threshold intact bilateral.   Musculoskeletal: Tenderness to palpation at the medial calcaneal tubercale and through the insertion of the plantar fascia of the left foot. All other joints range of motion within normal limits bilateral. Strength 5/5 in all groups bilateral.   Radiographic exam:   Normal osseous mineralization. Joint spaces preserved. No fracture/dislocation/boney destruction. Calcaneal spur present with mild thickening of plantar fascia left. No other soft tissue abnormalities or radiopaque foreign bodies.   Assessment: 1. Plantar fasciitis left foot  Plan of Care:  1. Patient evaluated. Xrays reviewed.   2. Injection of 0.5cc Celestone soluspan injected into the left plantar fascia.  3. Rx for Medrol Dose Pak placed 4. Rx for Mobic 15mg  ordered for patient. 5. Instructed patient regarding therapies and modalities at home to alleviate symptoms.  6. Appointment with Liliane Channel for new custom molded orthotics.  7. Note for work provided. No work for one week.  8. Return to clinic in 4 weeks.    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   Edrick Kins,  DPM [facility]  Dr. Edrick Kins, DPM    [address], [place] [address]                Office [phone]  Fax [phone]

## 2017-09-02 DIAGNOSIS — M722 Plantar fascial fibromatosis: Secondary | ICD-10-CM | POA: Diagnosis not present

## 2017-09-09 ENCOUNTER — Encounter: Payer: BC Managed Care – PPO | Admitting: Orthotics

## 2017-09-09 ENCOUNTER — Ambulatory Visit: Payer: BC Managed Care – PPO | Admitting: Orthotics

## 2017-09-09 DIAGNOSIS — M79672 Pain in left foot: Secondary | ICD-10-CM

## 2017-09-09 DIAGNOSIS — M722 Plantar fascial fibromatosis: Secondary | ICD-10-CM

## 2017-09-09 NOTE — Progress Notes (Signed)
Patient came in today to pick up custom made foot orthotics.  The goals were accomplished and the patient reported no dissatisfaction with said orthotics.  Patient was advised of breakin period and how to report any issues. 

## 2017-09-18 ENCOUNTER — Encounter: Payer: Self-pay | Admitting: Podiatry

## 2017-09-18 ENCOUNTER — Ambulatory Visit (INDEPENDENT_AMBULATORY_CARE_PROVIDER_SITE_OTHER): Payer: BC Managed Care – PPO | Admitting: Podiatry

## 2017-09-18 DIAGNOSIS — M722 Plantar fascial fibromatosis: Secondary | ICD-10-CM | POA: Diagnosis not present

## 2017-09-18 MED ORDER — DICLOFENAC SODIUM 75 MG PO TBEC: 75.0000 mg | DELAYED_RELEASE_TABLET | Freq: Two times a day (BID) | ORAL | 1 refills | Status: DC

## 2017-09-23 NOTE — Progress Notes (Signed)
   Subjective: 62 year old male presenting today for follow up evaluation of left plantar fasciitis. He reports continue pain in the foot. He states the orthotics only helped alleviate the pain for 2-3 days. He states the injection helped for about two weeks. He has been taking Mobic with no significant relief and has completed the Medrol Dose Pak. Patient is here for further evaluation and treatment.   Past Medical History:  Diagnosis Date  . Costochondritis 09/12/2015  . GERD (gastroesophageal reflux disease)   . Hypertension      Objective: Physical Exam General: The patient is alert and oriented x3 in no acute distress.  Dermatology: Skin is warm, dry and supple bilateral lower extremities. Negative for open lesions or macerations bilateral.   Vascular: Dorsalis Pedis and Posterior Tibial pulses palpable bilateral.  Capillary fill time is immediate to all digits.  Neurological: Epicritic and protective threshold intact bilateral.   Musculoskeletal: Tenderness to palpation at the medial calcaneal tubercale and through the insertion of the plantar fascia of the left foot. All other joints range of motion within normal limits bilateral. Strength 5/5 in all groups bilateral.   Assessment: 1. Plantar fasciitis left foot  Plan of Care:  1. Patient evaluated.    2. Injection of 0.5cc Celestone soluspan injected into the left plantar fascia.  3. Plantar fascial brace dispensed.  4. Discontinue taking Mobic. Prescription for Diclofenac provided to patient.  5. Note for work provided. No work for one week.  6. Return to clinic in 4 weeks.    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   Edrick Kins, DPM [facility]  Dr. Edrick Kins, DPM    [address], [place] [address]                Office [phone]  Fax [phone]

## 2017-10-20 ENCOUNTER — Encounter: Payer: Self-pay | Admitting: Podiatry

## 2017-10-20 ENCOUNTER — Ambulatory Visit: Payer: BC Managed Care – PPO | Admitting: Podiatry

## 2017-10-20 DIAGNOSIS — M722 Plantar fascial fibromatosis: Secondary | ICD-10-CM | POA: Diagnosis not present

## 2017-10-20 MED ORDER — DICLOFENAC SODIUM 75 MG PO TBEC: 75.0000 mg | DELAYED_RELEASE_TABLET | Freq: Two times a day (BID) | ORAL | 1 refills | Status: DC

## 2017-10-22 NOTE — Progress Notes (Signed)
   Subjective: 62 year old male presenting today for follow up evaluation of plantar fasciitis of the left foot. He states the pain has improved slightly but is still present. He states the pain is worse when walking and reports a pulling sensation in the heel. He has been taking Diclofenac for treatment. Patient is here for further evaluation and treatment.   Past Medical History:  Diagnosis Date  . Costochondritis 09/12/2015  . GERD (gastroesophageal reflux disease)   . Hypertension      Objective: Physical Exam General: The patient is alert and oriented x3 in no acute distress.  Dermatology: Skin is warm, dry and supple bilateral lower extremities. Negative for open lesions or macerations bilateral.   Vascular: Dorsalis Pedis and Posterior Tibial pulses palpable bilateral.  Capillary fill time is immediate to all digits.  Neurological: Epicritic and protective threshold intact bilateral.   Musculoskeletal: Tenderness to palpation to the plantar aspect of the left heel along the plantar fascia. All other joints range of motion within normal limits bilateral. Strength 5/5 in all groups bilateral.    Assessment: 1. Plantar fasciitis left foot - unchanged   Plan of Care:  1. Patient evaluated. 2. CAM boot dispensed. Weightbearing as tolerated.  3. Refill prescription for Diclofenac provided to patient.  4. Return to clinic in 4 weeks.     Edrick Kins, DPM [facility]  Dr. Edrick Kins, DPM    [address], [place] [address]                Office [phone]  Fax [phone]

## 2017-10-29 NOTE — Progress Notes (Signed)
Patient: Keith Villegas, Male    DOB: 09/05/1955, 62 y.o.   MRN: [id number] Visit Date: 10/30/2017  Today's Provider: Lelon Huh, MD   Chief Complaint  Patient presents with  . Annual Exam   Subjective:    Annual physical exam Keith Villegas is a 62 y.o. male who presents today for health maintenance and complete physical. He feels well. He reports he is not exercising. He reports he is sleeping well.  ----------------------------------------------------------------- Colonoscopy- 11/20/16 internal hemorrhoids colonoscopy repeat 5- 10 years Last CPE 10/21/16   Hypertension, follow-up:  BP Readings from Last 3 Encounters:  10/30/17 122/66  04/21/17 110/70  04/13/17 138/86    He was last seen for hypertension 1 years ago.  BP at that visit was 130/70. Management since that visit includes; no changes.He reports good compliance with treatment. He is not having side effects.  He is not exercising. He is adherent to low salt diet.   Outside blood pressures are not being checked. Patient denies chest pain, chest pressure/discomfort, claudication, dyspnea, exertional chest pressure/discomfort, fatigue, irregular heart beat, lower extremity edema, near-syncope, orthopnea, palpitations, paroxysmal nocturnal dyspnea, syncope and tachypnea.   Cardiovascular risk factors include dyslipidemia, hypertension, male gender, obesity (BMI >= 30 kg/m2), sedentary lifestyle and smoking/ tobacco exposure.  Use of agents associated with hypertension: NSAIDS and steroids.   ------------------------------------------------------------------------    Lipid/Cholesterol, Follow-up:   Last seen for this 1 years ago.  Management since that visit includes; labs checked, no changes.  Last Lipid Panel:    Component Value Date/Time   CHOL 164 10/21/2016 0957   TRIG 141 10/21/2016 0957   HDL 43 10/21/2016 0957   CHOLHDL 3.8 10/21/2016 0957   LDLCALC 93 10/21/2016 0957    He reports good  compliance with treatment. He is not having side effects.   Wt Readings from Last 3 Encounters:  10/30/17 253 lb (114.8 kg)  04/21/17 249 lb (112.9 kg)  04/13/17 247 lb (112 kg)    ------------------------------------------------------------------------  He is currently being treated for plantar fasciitis by Dr. Amalia Hailey.    Review of Systems  Constitutional: Negative.   HENT: Negative.   Eyes: Negative.   Respiratory: Negative.   Cardiovascular: Negative.   Gastrointestinal: Negative.   Endocrine: Negative.   Genitourinary: Negative.   Musculoskeletal: Negative.   Skin: Negative.   Allergic/Immunologic: Negative.   Neurological: Negative.   Hematological: Negative.   Psychiatric/Behavioral: Negative.     Social History      He  reports that he has quit smoking. He has never used smokeless tobacco. He reports that he does not drink alcohol or use drugs.       Social History   Socioeconomic History  . Marital status: Married    Spouse name: Not on file  . Number of children: 2  . Years of education: Not on file  . Highest education level: Not on file  Occupational History  . Occupation: Visual merchandiser    Comment: works at [employer]  . Financial resource strain: Not on file  . Food insecurity:    Worry: Not on file    Inability: Not on file  . Transportation needs:    Medical: Not on file    Non-medical: Not on file  Tobacco Use  . Smoking status: Former Research scientist (life sciences)  . Smokeless tobacco: Never Used  Substance and Sexual Activity  . Alcohol use: No    Alcohol/week: 0.0 oz  . Drug  use: No  . Sexual activity: Not on file  Lifestyle  . Physical activity:    Days per week: Not on file    Minutes per session: Not on file  . Stress: Not on file  Relationships  . Social connections:    Talks on phone: Not on file    Gets together: Not on file    Attends religious service: Not on file    Active member of club or organization: Not on file     Attends meetings of clubs or organizations: Not on file    Relationship status: Not on file  Other Topics Concern  . Not on file  Social History Narrative  . Not on file    Past Medical History:  Diagnosis Date  . Costochondritis 09/12/2015  . GERD (gastroesophageal reflux disease)   . Hypertension      Patient Active Problem List   Diagnosis Date Noted  . History of adenomatous polyp of colon 11/21/2016  . Hemorrhoids 06/18/2016  . Obesity 09/12/2015  . GERD (gastroesophageal reflux disease) 09/12/2015  . Hypogonadism, male 04/11/2011  . Allergic rhinitis due to pollen 03/12/2006  . Hyperlipidemia 06/23/1998  . Essential (primary) hypertension 06/23/1998    Past Surgical History:  Procedure Laterality Date  . APPENDECTOMY  1976  . COLONOSCOPY WITH PROPOFOL N/A 11/20/2016   Procedure: COLONOSCOPY WITH PROPOFOL;  Surgeon: Jonathon Bellows, MD;  Location: [facility] ENDOSCOPY;  Service: Endoscopy;  Laterality: N/A;  . echocardiogram  10/02/2010  . Myocardial perfusion scan  10/02/2010   normal perfusion. Normal systolic function. Borderline right ventricular dilation. EF>65%  . TOE SURGERY  2005    Family History        Family Status  Relation Name Status  . Mother  Alive  . Father  Alive       55  . Sister  Alive  . Brother  Alive  . Brother  Alive        His family history includes CAD in his father; Cancer in his mother.      No Known Allergies   Current Outpatient Medications:  .  aspirin 81 MG tablet, Take 81 mg by mouth daily. , Disp: , Rfl:  .  benazepril-hydrochlorthiazide (LOTENSIN HCT) 20-12.5 MG tablet, TAKE ONE TABLET BY MOUTH EVERY DAY, Disp: 30 tablet, Rfl: 10 .  diclofenac (VOLTAREN) 75 MG EC tablet, Take 1 tablet (75 mg total) by mouth 2 (two) times daily., Disp: 60 tablet, Rfl: 1 .  metoprolol succinate (TOPROL-XL) 50 MG 24 hr tablet, TAKE ONE TABLET BY MOUTH EVERY DAY, Disp: 30 tablet, Rfl: 12 .  Multiple Vitamin (MULTIVITAMIN) capsule, Take 1 capsule  by mouth daily., Disp: , Rfl:  .  omeprazole (PRILOSEC) 40 MG capsule, Take 1 capsule (40 mg total) by mouth daily., Disp: 30 capsule, Rfl: 12 .  rosuvastatin (CRESTOR) 10 MG tablet, TAKE ONE TABLET BY MOUTH EVERY DAY, Disp: 30 tablet, Rfl: 10 .  Aspirin-Calcium Carbonate 81-777 MG TABS, Take by mouth., Disp: , Rfl:  .  hydrocortisone-pramoxine (ANALPRAM HC) 2.5-1 % rectal cream, Place 1 application rectally 3 (three) times daily. (Patient not taking: Reported on 04/13/2017), Disp: 30 g, Rfl: 1 .  methocarbamol (ROBAXIN) 750 MG tablet, TAKE ONE TABLET BY MOUTH FOUR TIMES DAILY (Patient not taking: Reported on 10/30/2017), Disp: 50 tablet, Rfl: 4 .  methylPREDNISolone (MEDROL DOSEPAK) 4 MG TBPK tablet, 6 day dose pack - take as directed, Disp: 21 tablet, Rfl: 0  Current Facility-Administered Medications:  .  betamethasone acetate-betamethasone sodium phosphate (CELESTONE) injection 3 mg, 3 mg, Intramuscular, Once, Edrick Kins, DPM   Patient Care Team: Birdie Sons, MD as PCP - General (Family Medicine) [place], Romilda Garret, DPM as Consulting Physician (Podiatry) System, Provider Not In      Objective:   Vitals: BP 122/66 (BP Location: Left Arm, Patient Position: Sitting, Cuff Size: Large)   Pulse 72   Temp 98.8 F (37.1 C) (Oral)   Resp 16   Ht 5\' 9"  (1.753 m)   Wt 253 lb (114.8 kg)   SpO2 97%   BMI 37.36 kg/m       Physical Exam   General Appearance:    Alert, cooperative, no distress, appears stated age, obese  Head:    Normocephalic, without obvious abnormality, atraumatic  Eyes:    PERRL, conjunctiva/corneas clear, EOM's intact, fundi    benign, both eyes       Ears:    Normal TM's and external ear canals, both ears  Nose:   Nares normal, septum midline, mucosa normal, no drainage   or sinus tenderness  Throat:   Lips, mucosa, and tongue normal; teeth and gums normal  Neck:   Supple, symmetrical, trachea midline, no adenopathy;       thyroid:  No  enlargement/tenderness/nodules; no carotid   bruit or JVD  Back:     Symmetric, no curvature, ROM normal, no CVA tenderness  Lungs:     Clear to auscultation bilaterally, respirations unlabored  Chest wall:    No tenderness or deformity  Heart:    Regular rate and rhythm, S1 and S2 normal, no murmur, rub   or gallop  Abdomen:     Soft, non-tender, bowel sounds active all four quadrants,    no masses, no organomegaly  Genitalia:    deferred  Rectal:    deferred  Extremities:   Extremities normal, atraumatic, no cyanosis or edema  Pulses:   2+ and symmetric all extremities  Skin:   Skin color, texture, turgor normal, no rashes or lesions  Lymph nodes:   Cervical, supraclavicular, and axillary nodes normal  Neurologic:   CNII-XII intact. Normal strength, sensation and reflexes      throughout    Depression Screen PHQ 2/9 Scores 10/30/2017 10/21/2016 10/19/2015  PHQ - 2 Score 0 1 0  PHQ- 9 Score 0 2 -      Assessment & Plan:     Routine Health Maintenance and Physical Exam  Exercise Activities and Dietary recommendations Goals    None      Immunization History  Administered Date(s) Administered  . Influenza Split 06/07/2011  . Influenza-Unspecified 04/10/2017  . Tdap 09/21/2007    Health Maintenance  Topic Date Due  . HIV Screening  11/08/1971  . TETANUS/TDAP  09/20/2017  . INFLUENZA VACCINE  01/21/2018  . COLONOSCOPY  11/20/2021  . Hepatitis C Screening  Completed     Discussed health benefits of physical activity, and encouraged him to engage in regular exercise appropriate for his age and condition.    --------------------------------------------------------------------  1. Annual physical exam Counseled regarding prudent diet and regular exercise.  Td today - Comprehensive metabolic panel - Lipid panel - PSA  2. Prostate cancer screening  - PSA  3. Essential (primary) hypertension Well controlled.  Continue current medications.   - EKG 12-Lead  4.  Hyperlipidemia, unspecified hyperlipidemia type He is tolerating rosuvastatin well with no adverse effects.   - Comprehensive metabolic panel - Lipid panel  5. Hemorrhoids,  unspecified hemorrhoid type refill- hydrocortisone-pramoxine (ANALPRAM HC) 2.5-1 % rectal cream; Place 1 application rectally 3 (three) times daily.  Dispense: 30 g; Refill: 1   Lelon Huh, MD  [facility]

## 2017-10-30 ENCOUNTER — Encounter: Payer: Self-pay | Admitting: Family Medicine

## 2017-10-30 ENCOUNTER — Ambulatory Visit (INDEPENDENT_AMBULATORY_CARE_PROVIDER_SITE_OTHER): Payer: BC Managed Care – PPO | Admitting: Family Medicine

## 2017-10-30 VITALS — BP 122/66 | HR 72 | Temp 98.8°F | Resp 16 | Ht 69.0 in | Wt 253.0 lb

## 2017-10-30 DIAGNOSIS — Z23 Encounter for immunization: Secondary | ICD-10-CM | POA: Diagnosis not present

## 2017-10-30 DIAGNOSIS — I10 Essential (primary) hypertension: Secondary | ICD-10-CM | POA: Diagnosis not present

## 2017-10-30 DIAGNOSIS — Z125 Encounter for screening for malignant neoplasm of prostate: Secondary | ICD-10-CM | POA: Diagnosis not present

## 2017-10-30 DIAGNOSIS — K649 Unspecified hemorrhoids: Secondary | ICD-10-CM

## 2017-10-30 DIAGNOSIS — Z0000 Encounter for general adult medical examination without abnormal findings: Secondary | ICD-10-CM

## 2017-10-30 DIAGNOSIS — E785 Hyperlipidemia, unspecified: Secondary | ICD-10-CM

## 2017-10-30 MED ORDER — HYDROCORTISONE ACE-PRAMOXINE 2.5-1 % RE CREA
1.0000 | TOPICAL_CREAM | Freq: Three times a day (TID) | RECTAL | 1 refills | Status: AC
Start: 2017-10-30 — End: ?

## 2017-10-31 LAB — LIPID PANEL
CHOLESTEROL TOTAL: 211 mg/dL — AB (ref 100–199)
Chol/HDL Ratio: 4.7 ratio (ref 0.0–5.0)
HDL: 45 mg/dL (ref >39)
LDL Calculated: 135 mg/dL — ABNORMAL HIGH (ref 0–99)
Triglycerides: 154 mg/dL — ABNORMAL HIGH (ref 0–149)
VLDL CHOLESTEROL CAL: 31 mg/dL (ref 5–40)

## 2017-10-31 LAB — PSA: Prostate Specific Ag, Serum: 0.2 ng/mL (ref 0.0–4.0)

## 2017-10-31 LAB — COMPREHENSIVE METABOLIC PANEL
ALBUMIN: 4.5 g/dL (ref 3.6–4.8)
ALK PHOS: 86 IU/L (ref 39–117)
ALT: 50 IU/L — ABNORMAL HIGH (ref 0–44)
AST: 33 IU/L (ref 0–40)
Albumin/Globulin Ratio: 2 (ref 1.2–2.2)
BILIRUBIN TOTAL: 0.5 mg/dL (ref 0.0–1.2)
BUN / CREAT RATIO: 12 (ref 10–24)
BUN: 12 mg/dL (ref 8–27)
CHLORIDE: 100 mmol/L (ref 96–106)
CO2: 25 mmol/L (ref 20–29)
Calcium: 9.4 mg/dL (ref 8.6–10.2)
Creatinine, Ser: 1.03 mg/dL (ref 0.76–1.27)
GFR calc Af Amer: 91 mL/min/{1.73_m2} (ref >59)
GFR calc non Af Amer: 79 mL/min/{1.73_m2} (ref >59)
GLUCOSE: 104 mg/dL — AB (ref 65–99)
Globulin, Total: 2.3 g/dL (ref 1.5–4.5)
Potassium: 4 mmol/L (ref 3.5–5.2)
SODIUM: 141 mmol/L (ref 134–144)
Total Protein: 6.8 g/dL (ref 6.0–8.5)

## 2017-11-17 ENCOUNTER — Encounter: Payer: Self-pay | Admitting: Podiatry

## 2017-11-17 ENCOUNTER — Ambulatory Visit (INDEPENDENT_AMBULATORY_CARE_PROVIDER_SITE_OTHER): Payer: BC Managed Care – PPO | Admitting: Podiatry

## 2017-11-17 DIAGNOSIS — M722 Plantar fascial fibromatosis: Secondary | ICD-10-CM | POA: Diagnosis not present

## 2017-11-18 NOTE — Progress Notes (Signed)
   Subjective: 62 year old male presenting today for follow up evaluation of plantar fasciitis of the left foot. He states he is doing better. He reports some intermittent shooting pain of the foot. There are no modifying factors noted. Patient is here for further evaluation and treatment.   Past Medical History:  Diagnosis Date  . Costochondritis 09/12/2015  . GERD (gastroesophageal reflux disease)   . Hypertension     Objective: Physical Exam General: The patient is alert and oriented x3 in no acute distress.  Dermatology: Skin is cool, dry and supple bilateral lower extremities. Negative for open lesions or macerations.  Vascular: Palpable pedal pulses bilaterally. No edema or erythema noted. Capillary refill within normal limits.  Neurological: Epicritic and protective threshold grossly intact bilaterally.   Musculoskeletal Exam: All pedal and ankle joints range of motion within normal limits bilateral. Muscle strength 5/5 in all groups bilateral.    Assessment: 1. Plantar fasciitis left foot - resolved   Plan of Care:  1. Patient evaluated. 2. Transition out of CAM boot into good sneakers.  3. Continue taking Diclofenac as needed and wearing custom molded orthotics.  4. Resume wearing plantar fascial brace.  5. Return to clinic as needed.      Edrick Kins, DPM [facility]  Dr. Edrick Kins, DPM    [address], [place] [address]                Office [phone]  Fax [phone]

## 2017-12-21 ENCOUNTER — Other Ambulatory Visit: Payer: Self-pay | Admitting: Family Medicine

## 2017-12-21 DIAGNOSIS — K219 Gastro-esophageal reflux disease without esophagitis: Secondary | ICD-10-CM

## 2018-01-20 ENCOUNTER — Other Ambulatory Visit: Payer: Self-pay | Admitting: Family Medicine

## 2018-04-28 ENCOUNTER — Encounter: Payer: Self-pay | Admitting: Family Medicine

## 2018-04-28 ENCOUNTER — Ambulatory Visit: Payer: BC Managed Care – PPO | Admitting: Family Medicine

## 2018-04-28 VITALS — BP 124/70 | HR 64 | Temp 98.2°F | Resp 16 | Wt 251.0 lb

## 2018-04-28 DIAGNOSIS — J069 Acute upper respiratory infection, unspecified: Secondary | ICD-10-CM

## 2018-04-28 DIAGNOSIS — I10 Essential (primary) hypertension: Secondary | ICD-10-CM

## 2018-04-28 DIAGNOSIS — E785 Hyperlipidemia, unspecified: Secondary | ICD-10-CM

## 2018-04-28 MED ORDER — AZITHROMYCIN 250 MG PO TABS: ORAL_TABLET | ORAL | 0 refills | Status: AC

## 2018-04-28 NOTE — Progress Notes (Signed)
Patient: Keith Villegas Male    DOB: [birth year]   62 y.o.   MRN: [id number] Visit Date: 04/28/2018  Today's Provider: Lelon Huh, MD   Chief Complaint  Patient presents with  . Hypertension    Six month follow up  . Hyperlipidemia    Six month follow up  . URI    Started last week.   Subjective:    Hypertension  This is a chronic problem. The problem is unchanged. The problem is controlled. Pertinent negatives include no anxiety, blurred vision, chest pain, headaches, malaise/fatigue, neck pain, orthopnea, palpitations, peripheral edema, PND, shortness of breath or sweats. There are no associated agents to hypertension. There are no compliance problems.   Hyperlipidemia  This is a chronic problem. The problem is controlled. Recent lipid tests were reviewed and are normal. Pertinent negatives include no chest pain or shortness of breath. Current antihyperlipidemic treatment includes statins. There are no compliance problems.   URI   This is a new problem. The current episode started in the past 7 days. The problem has been unchanged. There has been no fever. Associated symptoms include congestion, coughing, a plugged ear sensation and a sore throat. Pertinent negatives include no abdominal pain, chest pain, diarrhea, dysuria, ear pain, headaches, joint pain, joint swelling, nausea, neck pain, rash, rhinorrhea, sinus pain, sneezing, swollen glands, vomiting or wheezing.   He feels that cough and chest congestion ore getting worse and is concerned since he is going on cruise next week.   Lab Results  Component Value Date   CHOL 211 (H) 10/30/2017   CHOL 164 10/21/2016   CHOL 190 10/19/2015   Lab Results  Component Value Date   HDL 45 10/30/2017   HDL 43 10/21/2016   HDL 46 10/19/2015   Lab Results  Component Value Date   LDLCALC 135 (H) 10/30/2017   LDLCALC 93 10/21/2016   LDLCALC 105 (H) 10/19/2015   Lab Results  Component Value Date   TRIG 154 (H) 10/30/2017   TRIG 141 10/21/2016   TRIG 197 (H) 10/19/2015   Lab Results  Component Value Date   CHOLHDL 4.7 10/30/2017   CHOLHDL 3.8 10/21/2016   CHOLHDL 4.1 10/19/2015   No results found for: LDLDIRECT BP Readings from Last 3 Encounters:  04/28/18 136/68  10/30/17 122/66  04/21/17 110/70   Wt Readings from Last 3 Encounters:  04/28/18 251 lb (113.9 kg)  10/30/17 253 lb (114.8 kg)  04/21/17 249 lb (112.9 kg)       No Known Allergies   Current Outpatient Medications:  .  aspirin 81 MG tablet, Take 81 mg by mouth daily. , Disp: , Rfl:  .  Aspirin-Calcium Carbonate 81-777 MG TABS, Take by mouth., Disp: , Rfl:  .  benazepril-hydrochlorthiazide (LOTENSIN HCT) 20-12.5 MG tablet, TAKE ONE TABLET BY MOUTH EVERY DAY, Disp: 30 tablet, Rfl: 10 .  metoprolol succinate (TOPROL-XL) 50 MG 24 hr tablet, TAKE ONE TABLET BY MOUTH EVERY DAY, Disp: 30 tablet, Rfl: 12 .  Multiple Vitamin (MULTIVITAMIN) capsule, Take 1 capsule by mouth daily., Disp: , Rfl:  .  omeprazole (PRILOSEC) 40 MG capsule, TAKE ONE CAPSULE BY MOUTH EVERY DAY, Disp: 30 capsule, Rfl: 12 .  rosuvastatin (CRESTOR) 10 MG tablet, TAKE ONE TABLET BY MOUTH EVERY DAY, Disp: 30 tablet, Rfl: 10 .  diclofenac (VOLTAREN) 75 MG EC tablet, Take 1 tablet (75 mg total) by mouth 2 (two) times daily. (Patient not taking: Reported on 04/28/2018), Disp: 60 tablet,  Rfl: 1 .  hydrocortisone-pramoxine (ANALPRAM HC) 2.5-1 % rectal cream, Place 1 application rectally 3 (three) times daily. (Patient not taking: Reported on 04/28/2018), Disp: 30 g, Rfl: 1 .  methocarbamol (ROBAXIN) 750 MG tablet, TAKE ONE TABLET BY MOUTH FOUR TIMES DAILY (Patient not taking: Reported on 10/30/2017), Disp: 50 tablet, Rfl: 4 .  methylPREDNISolone (MEDROL DOSEPAK) 4 MG TBPK tablet, 6 day dose pack - take as directed, Disp: 21 tablet, Rfl: 0  Current Facility-Administered Medications:  .  betamethasone acetate-betamethasone sodium phosphate (CELESTONE) injection 3 mg, 3 mg,  Intramuscular, Once, Edrick Kins, DPM  Review of Systems  Constitutional: Negative.  Negative for malaise/fatigue.  HENT: Positive for congestion, postnasal drip, sore throat and voice change. Negative for ear discharge, ear pain, rhinorrhea, sinus pressure, sinus pain, sneezing, tinnitus and trouble swallowing.   Eyes: Negative.  Negative for blurred vision.  Respiratory: Positive for cough. Negative for apnea, choking, chest tightness, shortness of breath, wheezing and stridor.   Cardiovascular: Negative.  Negative for chest pain, palpitations, orthopnea and PND.  Gastrointestinal: Negative.  Negative for abdominal pain, diarrhea, nausea and vomiting.  Genitourinary: Negative for dysuria.  Musculoskeletal: Negative.  Negative for joint pain and neck pain.  Skin: Negative for rash.  Neurological: Negative for dizziness, light-headedness and headaches.  Hematological: Negative for adenopathy.    Social History   Tobacco Use  . Smoking status: Former Research scientist (life sciences)  . Smokeless tobacco: Never Used  Substance Use Topics  . Alcohol use: No    Alcohol/week: 0.0 standard drinks   Objective:   BP 136/68 (BP Location: Right Arm, Patient Position: Sitting, Cuff Size: Large)   Pulse 64   Temp 98.2 F (36.8 C) (Oral)   Resp 16   Wt 251 lb (113.9 kg)   BMI 37.07 kg/m  Vitals:   04/28/18 0818  BP: 136/68  Pulse: 64  Resp: 16  Temp: 98.2 F (36.8 C)  TempSrc: Oral  Weight: 251 lb (113.9 kg)     Physical Exam  General Appearance:    Alert, cooperative, no distress  HENT:   bilateral TM normal without fluid or infection, neck without nodes, sinuses nontender, nasal mucosa congested and nasal mucosa pale and congested  Eyes:    PERRL, conjunctiva/corneas clear, EOM's intact       Lungs:     Clear to auscultation bilaterally, respirations unlabored  Heart:    Regular rate and rhythm  Neurologic:   Awake, alert, oriented x 3. No apparent focal neurological           defect.            Assessment & Plan:      1. Essential (primary) hypertension Well controlled.  Continue current medications.    2. Hyperlipidemia, unspecified hyperlipidemia type He is tolerating atorvastatin well with no adverse effects.    3. Upper respiratory tract infection, unspecified type Start OTC cetirizine. Start zpack if not improving in 2-3 days.   Follow up CPE in May.        Lelon Huh, MD  [facility]

## 2018-04-28 NOTE — Patient Instructions (Signed)
   Take OTC cetrizine (Zyrtec) once a day to help dry up mucous in chest.

## 2018-05-03 ENCOUNTER — Ambulatory Visit: Payer: Self-pay | Admitting: Family Medicine

## 2018-05-18 ENCOUNTER — Other Ambulatory Visit: Payer: Self-pay

## 2018-05-18 MED ORDER — METOPROLOL SUCCINATE ER 50 MG PO TB24: 50.0000 mg | ORAL_TABLET | Freq: Every day | ORAL | 12 refills | Status: DC

## 2018-08-02 ENCOUNTER — Telehealth: Payer: Self-pay

## 2018-08-02 ENCOUNTER — Encounter: Payer: Self-pay | Admitting: Family Medicine

## 2018-08-02 ENCOUNTER — Ambulatory Visit: Payer: BC Managed Care – PPO | Admitting: Family Medicine

## 2018-08-02 ENCOUNTER — Telehealth: Payer: Self-pay | Admitting: Family Medicine

## 2018-08-02 ENCOUNTER — Ambulatory Visit
Admission: RE | Admit: 2018-08-02 | Discharge: 2018-08-02 | Disposition: A | Discharge status: Home / Self Care | Payer: BC Managed Care – PPO | Source: Ambulatory Visit | Attending: Family Medicine | Admitting: Family Medicine

## 2018-08-02 VITALS — BP 148/70 | HR 61 | Temp 97.6°F | Resp 16 | Wt 255.0 lb

## 2018-08-02 DIAGNOSIS — R0600 Dyspnea, unspecified: Secondary | ICD-10-CM

## 2018-08-02 DIAGNOSIS — G3281 Cerebellar ataxia in diseases classified elsewhere: Secondary | ICD-10-CM | POA: Insufficient documentation

## 2018-08-02 DIAGNOSIS — H538 Other visual disturbances: Secondary | ICD-10-CM | POA: Diagnosis present

## 2018-08-02 DIAGNOSIS — R002 Palpitations: Secondary | ICD-10-CM

## 2018-08-02 NOTE — Progress Notes (Signed)
Error. Letter already given to patient by Arbie Cookey.

## 2018-08-02 NOTE — Telephone Encounter (Signed)
Do they still need this? Test has already been completed.

## 2018-08-02 NOTE — Telephone Encounter (Signed)
Bryson Ha with imaging called to give call report on pt's CT of brain. I wasn't able to get a CMA. Bryson Ha stated CT of brain was normal and that she is going to let pt leave. Please advise. Thanks TNP

## 2018-08-02 NOTE — Patient Instructions (Signed)
.   Please review the attached list of medications and notify my office if there are any errors.   . Please bring all of your medications to every appointment so we can make sure that our medication list is the same as yours.   

## 2018-08-02 NOTE — Progress Notes (Signed)
Patient: Keith Villegas Male    DOB: August 07, 1955   63 y.o.   MRN: [id number] Visit Date: 08/02/2018  Today's Provider: Lelon Huh, MD   Chief Complaint  Patient presents with  . Dizziness   Subjective:     HPI Transition of Care: Patient comes in today reporting that he was seen in the ED of [facility] last night for Palpitations. He was told to follow up wit his PCP. Work up included normal troponin, cbc, chem 12. EKG only remarkable for 1st degree AV block.   He states that he returned from trip to [place] in the evening of 07-30-2018 and felt fine at that time. When he got up the next morning he felt dizzy, had trouble with his balance, felt short of breath, fluttering heart and his vision was blurry. No fevers, chills, sweats, nausea of vomiting, no spinning sensations.   Patient has still experienced palpitations and dizziness since being seen in the ER. States he can't seem to focus his vision. No diplopia.   No Known Allergies   Current Outpatient Medications:  .  aspirin 81 MG tablet, Take 81 mg by mouth daily. , Disp: , Rfl:  .  benazepril-hydrochlorthiazide (LOTENSIN HCT) 20-12.5 MG tablet, TAKE ONE TABLET BY MOUTH EVERY DAY, Disp: 30 tablet, Rfl: 10 .  hydrocortisone-pramoxine (ANALPRAM HC) 2.5-1 % rectal cream, Place 1 application rectally 3 (three) times daily., Disp: 30 g, Rfl: 1 .  metoprolol succinate (TOPROL-XL) 50 MG 24 hr tablet, Take 1 tablet (50 mg total) by mouth daily. Take with or immediately following a meal., Disp: 30 tablet, Rfl: 12 .  Multiple Vitamin (MULTIVITAMIN) capsule, Take 1 capsule by mouth daily., Disp: , Rfl:  .  omeprazole (PRILOSEC) 40 MG capsule, TAKE ONE CAPSULE BY MOUTH EVERY DAY, Disp: 30 capsule, Rfl: 12 .  rosuvastatin (CRESTOR) 10 MG tablet, TAKE ONE TABLET BY MOUTH EVERY DAY, Disp: 30 tablet, Rfl: 10  Review of Systems  Constitutional: Negative for appetite change, chills and fever.  Eyes: Positive for visual disturbance.    Respiratory: Positive for shortness of breath. Negative for chest tightness and wheezing.   Cardiovascular: Positive for palpitations. Negative for chest pain.  Gastrointestinal: Negative for abdominal pain, nausea and vomiting.  Neurological: Positive for dizziness and headaches (mild).    Social History   Tobacco Use  . Smoking status: Former Research scientist (life sciences)  . Smokeless tobacco: Never Used  Substance Use Topics  . Alcohol use: No    Alcohol/week: 0.0 standard drinks      Objective:   BP (!) 148/70 (BP Location: Right Arm, Cuff Size: Large)   Pulse 61   Temp 97.6 F (36.4 C) (Oral)   Resp 16   Wt 255 lb (115.7 kg)   SpO2 97% Comment: room air  BMI 37.66 kg/m  Vitals:   08/02/18 1051 08/02/18 1054  BP: (!) 148/82 (!) 148/70  Pulse: 61   Resp: 16   Temp: 97.6 F (36.4 C)   TempSrc: Oral   SpO2: 97%   Weight: 255 lb (115.7 kg)      Physical Exam   General Appearance:    Alert, cooperative, no distress  Eyes:    PERRL, conjunctiva/corneas clear, EOM's intact       Lungs:     Clear to auscultation bilaterally, respirations unlabored  Heart:    Regular rate and rhythm, no mumrurs, no edema.   Neurologic:   Awake, alert, oriented x 3. Rhomberg positive, finger  to nose normal. No nystagmus.          Assessment & Plan    1. 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[facility]) Concerning for cerebellar ischemic event.  - CT Head Wo Contrast; Future  2. Blurry vision, bilateral  - CT Head Wo Contrast; Future  3. Dyspnea, unspecified type Unclear if there is underlying cardiac or neurogenic cause. check- Brain natriuretic peptide Had normal CBC and troponin yesterday.   4. Palpitations Normal cardiac exam and normal EKG at ER yesterday. - TSH  Consider Holter monitor.     Lelon Huh, MD  [facility]

## 2018-08-02 NOTE — Telephone Encounter (Signed)
Pt advised.   Thanks,   -Anzlee Hinesley  

## 2018-08-02 NOTE — Telephone Encounter (Signed)
Insurance company would like to speak peer to peer to get CT of head approved.Phone [phone]. Member # [id number]

## 2018-08-02 NOTE — Telephone Encounter (Signed)
-----   Message from Birdie Sons, MD sent at 08/02/2018  1:19 PM EST ----- Head CT is normal. Should have lab results back by tomorrow.

## 2018-08-02 NOTE — Telephone Encounter (Signed)
Yes

## 2018-08-03 LAB — TSH: TSH: 1.96 u[IU]/mL (ref 0.450–4.500)

## 2018-08-03 LAB — BRAIN NATRIURETIC PEPTIDE: BNP: 11.9 pg/mL (ref 0.0–100.0)

## 2018-08-03 NOTE — Telephone Encounter (Signed)
Authorization [id number]

## 2018-08-04 ENCOUNTER — Telehealth: Payer: Self-pay

## 2018-08-04 ENCOUNTER — Telehealth: Payer: Self-pay | Admitting: Family Medicine

## 2018-08-04 NOTE — Telephone Encounter (Signed)
Pt advised of lab results.   Thanks,   -Jaimee Corum  

## 2018-08-04 NOTE — Telephone Encounter (Signed)
Pt advised.  He states he is feeling a lot better now.  He will call back if his symptoms come back.   Thanks,   -Mickel Baas

## 2018-08-04 NOTE — Telephone Encounter (Signed)
Pt calling for latest lab results. Please call pt back to discuss.  Thanks, [employer]

## 2018-08-04 NOTE — Telephone Encounter (Signed)
-----   Message from Birdie Sons, MD sent at 08/03/2018  3:29 PM EST ----- Labs are completely normal. No explanation for palpitations or dizziness. If he is not feeling better then need to order 48 hour Holter monitor.

## 2018-10-25 ENCOUNTER — Encounter: Payer: BC Managed Care – PPO | Admitting: Family Medicine

## 2018-12-17 ENCOUNTER — Telehealth: Payer: Self-pay | Admitting: Family Medicine

## 2018-12-17 ENCOUNTER — Other Ambulatory Visit: Payer: Self-pay

## 2018-12-17 DIAGNOSIS — K219 Gastro-esophageal reflux disease without esophagitis: Secondary | ICD-10-CM

## 2018-12-17 NOTE — Telephone Encounter (Signed)
Pt calling back to check on status of Rx refills.  Thanks, [employer]

## 2018-12-17 NOTE — Telephone Encounter (Signed)
[place] calling for medication refill for patient. benazepril-hydrochlorthiazide (LOTENSIN HCT) 20-12.5 MG  omeprazole (PRILOSEC) 40 MG capsule rosuvastatin (CRESTOR) 10 MG tablet

## 2018-12-18 ENCOUNTER — Other Ambulatory Visit: Payer: Self-pay | Admitting: Family Medicine

## 2018-12-18 MED ORDER — OMEPRAZOLE 40 MG PO CPDR: 40.0000 mg | DELAYED_RELEASE_CAPSULE | Freq: Every day | ORAL | 10 refills | Status: DC

## 2019-01-07 ENCOUNTER — Other Ambulatory Visit: Payer: Self-pay

## 2019-01-07 ENCOUNTER — Encounter: Payer: Self-pay | Admitting: Family Medicine

## 2019-01-07 ENCOUNTER — Ambulatory Visit (INDEPENDENT_AMBULATORY_CARE_PROVIDER_SITE_OTHER): Payer: BC Managed Care – PPO | Admitting: Family Medicine

## 2019-01-07 VITALS — BP 142/80 | HR 72 | Temp 98.2°F | Resp 16 | Ht 69.0 in | Wt 257.0 lb

## 2019-01-07 DIAGNOSIS — E785 Hyperlipidemia, unspecified: Secondary | ICD-10-CM

## 2019-01-07 DIAGNOSIS — Z0000 Encounter for general adult medical examination without abnormal findings: Secondary | ICD-10-CM | POA: Diagnosis not present

## 2019-01-07 DIAGNOSIS — Z125 Encounter for screening for malignant neoplasm of prostate: Secondary | ICD-10-CM

## 2019-01-07 DIAGNOSIS — I10 Essential (primary) hypertension: Secondary | ICD-10-CM | POA: Diagnosis not present

## 2019-01-07 NOTE — Progress Notes (Signed)
Patient: Keith Villegas, Male    DOB: January 06, 1956, 63 y.o.   MRN: [id number] Visit Date: 01/07/2019  Today's Provider: Lelon Huh, MD   Chief Complaint  Patient presents with  . Annual Exam   Subjective:     Annual physical exam Keith Villegas is a 63 y.o. male who presents today for health maintenance and complete physical. He feels fairly well. He reports no regular exercising. He reports he is sleeping fairly well.  -----------------------------------------------------------------   Hypertension, follow-up:  BP Readings from Last 3 Encounters:  01/07/19 140/80  08/02/18 (!) 148/70  04/28/18 124/70    He was last seen for hypertension 8 months ago.  BP at that visit was 124/70. Management since that visit includes no changes. He reports good compliance with treatment, although he did not take his medications this morning.  He is not having side effects.  He is not exercising. He is adherent to low salt diet.   Outside blood pressures are not checked. He is experiencing none.  Patient denies chest pain, chest pressure/discomfort, claudication, dyspnea, exertional chest pressure/discomfort, fatigue, irregular heart beat, lower extremity edema, near-syncope, orthopnea, palpitations, paroxysmal nocturnal dyspnea, syncope and tachypnea.   Cardiovascular risk factors include advanced age (older than 68 for men, 74 for women), hypertension and male gender.  Use of agents associated with hypertension: NSAIDS.     Weight trend: stable Wt Readings from Last 3 Encounters:  01/07/19 257 lb (116.6 kg)  08/02/18 255 lb (115.7 kg)  04/28/18 251 lb (113.9 kg)    Current diet: well balanced  ------------------------------------------------------------------------  Lipid/Cholesterol, Follow-up:   Last seen for this 8 months ago.  Management changes since that visit include none. . Last Lipid Panel:    Component Value Date/Time   CHOL 211 (H) 10/30/2017 0956   TRIG  154 (H) 10/30/2017 0956   HDL 45 10/30/2017 0956   CHOLHDL 4.7 10/30/2017 0956   LDLCALC 135 (H) 10/30/2017 0956    Risk factors for vascular disease include hypercholesterolemia and hypertension  He reports good compliance with treatment. He is not having side effects.  Current symptoms include none and have been stable. Weight trend: stable Prior visit with dietician: no Current diet: well balanced Current exercise: none  Wt Readings from Last 3 Encounters:  01/07/19 257 lb (116.6 kg)  08/02/18 255 lb (115.7 kg)  04/28/18 251 lb (113.9 kg)    -------------------------------------------------------------------  Follow up for GERD:  Current treatment includes Omeprazole.   He reports good compliance with treatment. He feels that condition is Improved. He is not having side effects.   ------------------------------------------------------------------------------------  Review of Systems  Constitutional: Negative for appetite change, chills, fatigue and fever.  HENT: Negative for congestion, ear pain, hearing loss, nosebleeds and trouble swallowing.   Eyes: Negative for pain and visual disturbance.  Respiratory: Negative for cough, chest tightness, shortness of breath and wheezing.   Cardiovascular: Negative for chest pain, palpitations and leg swelling.  Gastrointestinal: Negative for abdominal pain, blood in stool, constipation, diarrhea, nausea and vomiting.  Endocrine: Negative for polydipsia, polyphagia and polyuria.  Genitourinary: Negative for dysuria and flank pain.  Musculoskeletal: Negative for arthralgias, back pain, joint swelling, myalgias and neck stiffness.  Skin: Negative for color change, rash and wound.  Neurological: Negative for dizziness, tremors, seizures, speech difficulty, weakness, light-headedness and headaches.  Psychiatric/Behavioral: Negative for behavioral problems, confusion, decreased concentration, dysphoric mood and sleep disturbance. The  patient is not nervous/anxious.   All other systems  reviewed and are negative.   Social History      He  reports that he has quit smoking. He has never used smokeless tobacco. He reports that he does not drink alcohol or use drugs.       Social History   Socioeconomic History  . Marital status: Married    Spouse name: Not on file  . Number of children: 2  . Years of education: Not on file  . Highest education level: Not on file  Occupational History  . Occupation: Visual merchandiser    Comment: works at [employer]  . Financial resource strain: Not on file  . Food insecurity    Worry: Not on file    Inability: Not on file  . Transportation needs    Medical: Not on file    Non-medical: Not on file  Tobacco Use  . Smoking status: Former Research scientist (life sciences)  . Smokeless tobacco: Never Used  Substance and Sexual Activity  . Alcohol use: No    Alcohol/week: 0.0 standard drinks  . Drug use: No  . Sexual activity: Not on file  Lifestyle  . Physical activity    Days per week: Not on file    Minutes per session: Not on file  . Stress: Not on file  Relationships  . Social Herbalist on phone: Not on file    Gets together: Not on file    Attends religious service: Not on file    Active member of club or organization: Not on file    Attends meetings of clubs or organizations: Not on file    Relationship status: Not on file  Other Topics Concern  . Not on file  Social History Narrative  . Not on file    Past Medical History:  Diagnosis Date  . Costochondritis 09/12/2015  . GERD (gastroesophageal reflux disease)   . Hypertension      Patient Active Problem List   Diagnosis Date Noted  . History of adenomatous polyp of colon 11/21/2016  . Hemorrhoids 06/18/2016  . Obesity 09/12/2015  . GERD (gastroesophageal reflux disease) 09/12/2015  . Hypogonadism, male 04/11/2011  . Allergic rhinitis due to pollen 03/12/2006  . Hyperlipidemia 06/23/1998  .  Essential (primary) hypertension 06/23/1998    Past Surgical History:  Procedure Laterality Date  . APPENDECTOMY  1976  . COLONOSCOPY WITH PROPOFOL N/A 11/20/2016   Procedure: COLONOSCOPY WITH PROPOFOL;  Surgeon: Jonathon Bellows, MD;  Location: [facility] ENDOSCOPY;  Service: Endoscopy;  Laterality: N/A;  . echocardiogram  10/02/2010  . Myocardial perfusion scan  10/02/2010   normal perfusion. Normal systolic function. Borderline right ventricular dilation. EF>65%  . TOE SURGERY  2005    Family History        Family Status  Relation Name Status  . Mother  Deceased       Cause of Death: COVID-19  . Father  Alive       19  . Sister  Alive  . Brother  Alive  . Brother  Alive        His family history includes CAD in his father; Cancer in his mother.      No Known Allergies   Current Outpatient Medications:  .  aspirin 81 MG tablet, Take 81 mg by mouth daily. , Disp: , Rfl:  .  benazepril-hydrochlorthiazide (LOTENSIN HCT) 20-12.5 MG tablet, TAKE ONE TABLET BY MOUTH ONCE DAILY, Disp: 30 tablet, Rfl: 10 .  hydrocortisone-pramoxine (ANALPRAM HC) 2.5-1 %  rectal cream, Place 1 application rectally 3 (three) times daily., Disp: 30 g, Rfl: 1 .  metoprolol succinate (TOPROL-XL) 50 MG 24 hr tablet, Take 1 tablet (50 mg total) by mouth daily. Take with or immediately following a meal., Disp: 30 tablet, Rfl: 12 .  Multiple Vitamin (MULTIVITAMIN) capsule, Take 1 capsule by mouth daily., Disp: , Rfl:  .  omeprazole (PRILOSEC) 40 MG capsule, Take 1 capsule (40 mg total) by mouth daily., Disp: 30 capsule, Rfl: 10 .  rosuvastatin (CRESTOR) 10 MG tablet, TAKE ONE TABLET BY MOUTH ONCE DAILY, Disp: 30 tablet, Rfl: 10   Patient Care Team: Birdie Sons, MD as PCP - General (Family Medicine) [place], Romilda Garret, DPM as Consulting Physician (Podiatry) System, Provider Not In Edrick Kins, DPM as Consulting Physician (Podiatry)    Objective:    Vitals: BP 140/80 (BP Location: Left Arm, Patient Position:  Sitting, Cuff Size: Large)   Pulse 72   Temp 98.2 F (36.8 C) (Oral)   Resp 16   Ht 5\' 9"  (1.753 m)   Wt 257 lb (116.6 kg)   SpO2 98% Comment: room air  BMI 37.95 kg/m    Vitals:   01/07/19 1017 01/07/19 1105 01/07/19 1106  BP: 140/80 (!) 144/80 (!) 142/80  Pulse: 72    Resp: 16    Temp: 98.2 F (36.8 C)    TempSrc: Oral    SpO2: 98%    Weight: 257 lb (116.6 kg)    Height: 5\' 9"  (1.753 m)       Physical Exam   General Appearance:    Alert, cooperative, no distress, appears stated age  Head:    Normocephalic, without obvious abnormality, atraumatic  Eyes:    PERRL, conjunctiva/corneas clear, EOM's intact, fundi    benign, both eyes       Ears:    Normal TM's and external ear canals, both ears  Nose:   Nares normal, septum midline, mucosa normal, no drainage   or sinus tenderness  Throat:   Lips, mucosa, and tongue normal; teeth and gums normal  Neck:   Supple, symmetrical, trachea midline, no adenopathy;       thyroid:  No enlargement/tenderness/nodules; no carotid   bruit or JVD  Back:     Symmetric, no curvature, ROM normal, no CVA tenderness  Lungs:     Clear to auscultation bilaterally, respirations unlabored  Chest wall:    No tenderness or deformity  Heart:    Regular rate and rhythm, S1 and S2 normal, no murmur, rub   or gallop  Abdomen:     Soft, non-tender, bowel sounds active all four quadrants,    no masses, no organomegaly  Genitalia:    deferred  Rectal:    deferred  Extremities:   Extremities normal, atraumatic, no cyanosis or edema  Pulses:   2+ and symmetric all extremities  Skin:   Skin color, texture, turgor normal, no rashes or lesions  Lymph nodes:   Cervical, supraclavicular, and axillary nodes normal  Neurologic:   CNII-XII intact. Normal strength, sensation and reflexes      throughout    Depression Screen PHQ 2/9 Scores 01/07/2019 10/30/2017 10/21/2016 10/19/2015  PHQ - 2 Score 0 0 1 0  PHQ- 9 Score 0 0 2 -       Assessment & Plan:      Routine Health Maintenance and Physical Exam  Exercise Activities and Dietary recommendations Goals   None     Immunization History  Administered Date(s) Administered  . Influenza Split 06/07/2011  . Influenza-Unspecified 04/10/2017, 04/07/2018  . Td 10/30/2017  . Tdap 09/21/2007    Health Maintenance  Topic Date Due  . HIV Screening  11/08/1971  . INFLUENZA VACCINE  01/22/2019  . COLONOSCOPY  11/20/2021  . TETANUS/TDAP  10/31/2027  . Hepatitis C Screening  Completed     Discussed health benefits of physical activity, and encouraged him to engage in regular exercise appropriate for his age and condition.    --------------------------------------------------------------------  1. Annual physical exam Generally doing well, unremarkable exam.  - Comprehensive metabolic panel - Lipid panel - PSA - EKG 12-Lead - CBC  2. Prostate cancer screening  - PSA  3. Essential (primary) hypertension Usually well controlled, reporting home Bps in the 120s. Likely elevated today due to not taking his medications this morning.  - EKG 12-Lead  4. Hyperlipidemia, unspecified hyperlipidemia type He is tolerating rosuvastatin well with no adverse effects.   - Comprehensive metabolic panel - Lipid panel - CBC   The entirety of the information documented in the History of Present Illness, Review of Systems and Physical Exam were personally obtained by me. Portions of this information were initially documented by Meyer Cory, CMA and reviewed by me for thoroughness and accuracy.    Lelon Huh, MD  [facility]

## 2019-01-07 NOTE — Patient Instructions (Signed)
.   Please review the attached list of medications and notify my office if there are any errors.   . Please bring all of your medications to every appointment so we can make sure that our medication list is the same as yours.   . We will have flu vaccines available after Labor Day. Please go to your pharmacy or call the office in early September to schedule you flu shot.   

## 2019-01-08 LAB — CBC
Hematocrit: 41 % (ref 37.5–51.0)
Hemoglobin: 14.6 g/dL (ref 13.0–17.7)
MCH: 28.7 pg (ref 26.6–33.0)
MCHC: 35.6 g/dL (ref 31.5–35.7)
MCV: 81 fL (ref 79–97)
Platelets: 218 10*3/uL (ref 150–450)
RBC: 5.08 x10E6/uL (ref 4.14–5.80)
RDW: 13.2 % (ref 11.6–15.4)
WBC: 8.2 10*3/uL (ref 3.4–10.8)

## 2019-01-08 LAB — COMPREHENSIVE METABOLIC PANEL
ALT: 28 IU/L (ref 0–44)
AST: 25 IU/L (ref 0–40)
Albumin/Globulin Ratio: 1.8 (ref 1.2–2.2)
Albumin: 4.6 g/dL (ref 3.8–4.8)
Alkaline Phosphatase: 103 IU/L (ref 39–117)
BUN/Creatinine Ratio: 11 (ref 10–24)
BUN: 12 mg/dL (ref 8–27)
Bilirubin Total: 0.6 mg/dL (ref 0.0–1.2)
CO2: 24 mmol/L (ref 20–29)
Calcium: 9.8 mg/dL (ref 8.6–10.2)
Chloride: 99 mmol/L (ref 96–106)
Creatinine, Ser: 1.07 mg/dL (ref 0.76–1.27)
GFR calc Af Amer: 86 mL/min/{1.73_m2} (ref >59)
GFR calc non Af Amer: 74 mL/min/{1.73_m2} (ref >59)
Globulin, Total: 2.5 g/dL (ref 1.5–4.5)
Glucose: 98 mg/dL (ref 65–99)
Potassium: 3.8 mmol/L (ref 3.5–5.2)
Sodium: 140 mmol/L (ref 134–144)
Total Protein: 7.1 g/dL (ref 6.0–8.5)

## 2019-01-08 LAB — LIPID PANEL
Chol/HDL Ratio: 4.4 ratio (ref 0.0–5.0)
Cholesterol, Total: 195 mg/dL (ref 100–199)
HDL: 44 mg/dL (ref >39)
LDL Calculated: 113 mg/dL — ABNORMAL HIGH (ref 0–99)
Triglycerides: 189 mg/dL — ABNORMAL HIGH (ref 0–149)
VLDL Cholesterol Cal: 38 mg/dL (ref 5–40)

## 2019-01-08 LAB — PSA: Prostate Specific Ag, Serum: 0.2 ng/mL (ref 0.0–4.0)

## 2019-01-10 ENCOUNTER — Telehealth: Payer: Self-pay

## 2019-01-10 NOTE — Telephone Encounter (Signed)
Pt advised.   Thanks,   -Laura  

## 2019-01-10 NOTE — Telephone Encounter (Signed)
-----   Message from Birdie Sons, MD sent at 01/09/2019  8:17 AM EDT ----- Cholesterol is pretty well controlled at 195. Normal psa, kidney and liver functions are normal. Continue current medications.  Check labs yearly.

## 2019-05-23 IMAGING — CT CT HEAD W/O CM
3 series · 15 of 47 positions shown, 18 images · non-contrast
Comparison: None.

CLINICAL DATA: He states that he returned from trip to [place] in the evening of 07-30-2018 and felt fine at that time. When he
got up the next morning he felt dizzy, had trouble with his balance,
felt short of breath, fluttering heart and his vision was blurry. No
fevers, chills, sweats, nausea of vomiting, no spinning sensations.
Patient has still experienced palpitations and dizziness since being
seen in the ER. States he can't seem to focus his vision.

EXAM:
CT HEAD WITHOUT CONTRAST
TECHNIQUE: Contiguous axial images were obtained from the base of the skull
through the vertex without intravenous contrast.

[Series 2: head wo · axial · 0.47mm/px · z∈[-143,-13]mm · 9 of 32 slices shown, 12 images]
[im 3/32  brain]
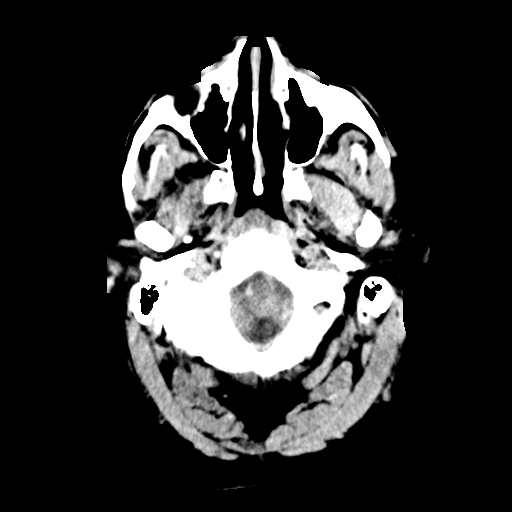
[im 3/32  bone]
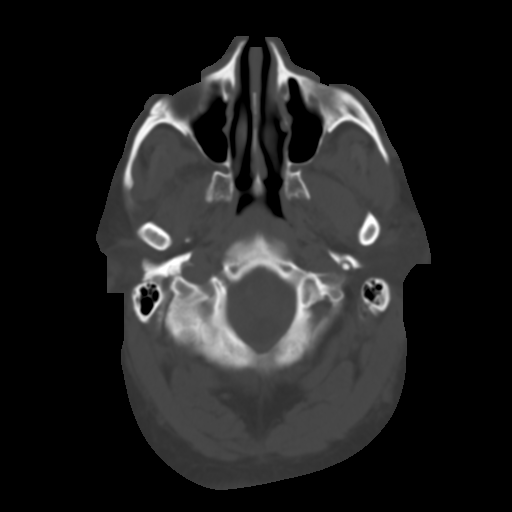
[im 6/32  brain]
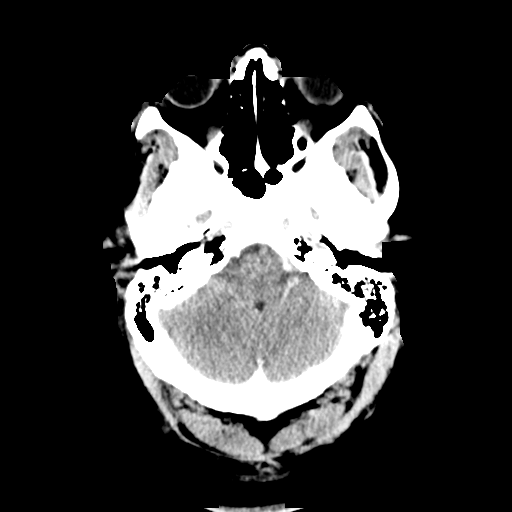
[im 9/32  brain]
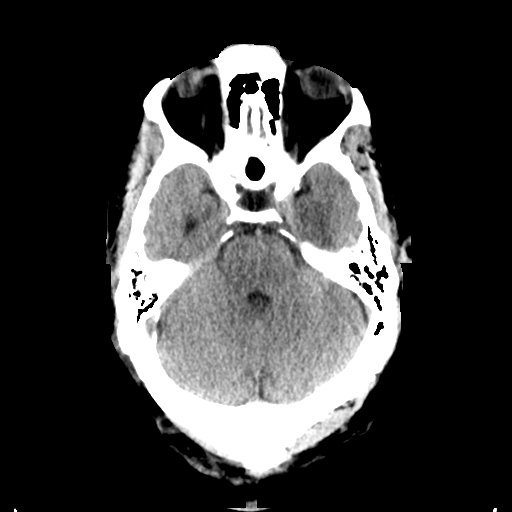
[im 12/32  brain]
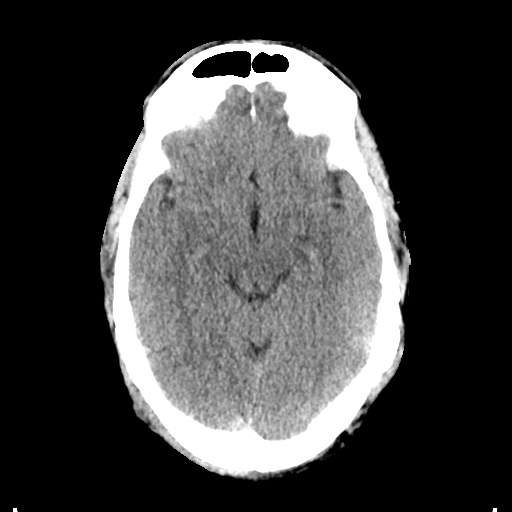
[im 17/32  brain]
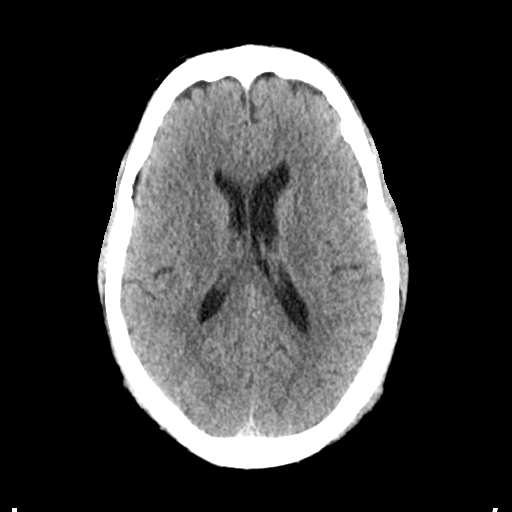
[im 17/32  bone]
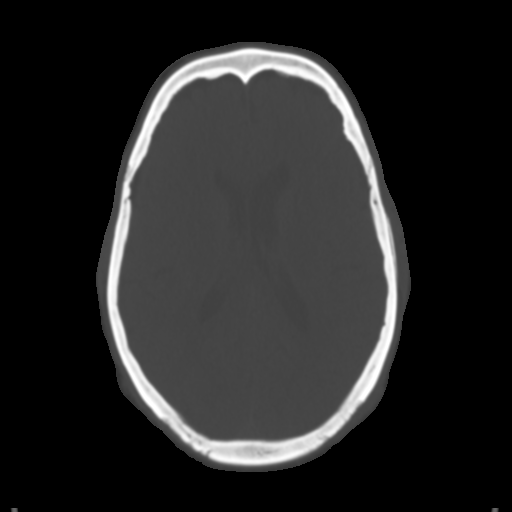
[im 20/32  brain]
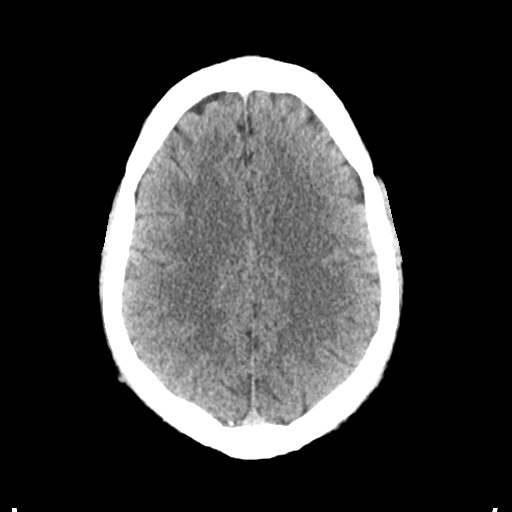
[im 23/32  brain]
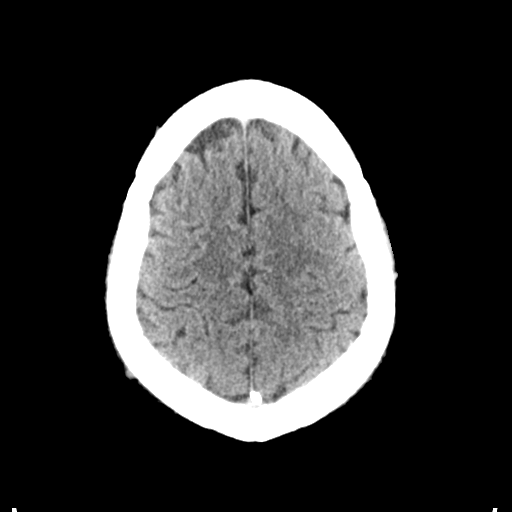
[im 26/32  brain]
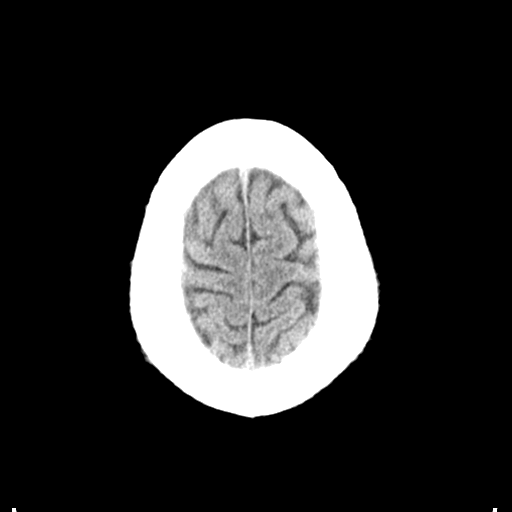
[im 29/32  brain]
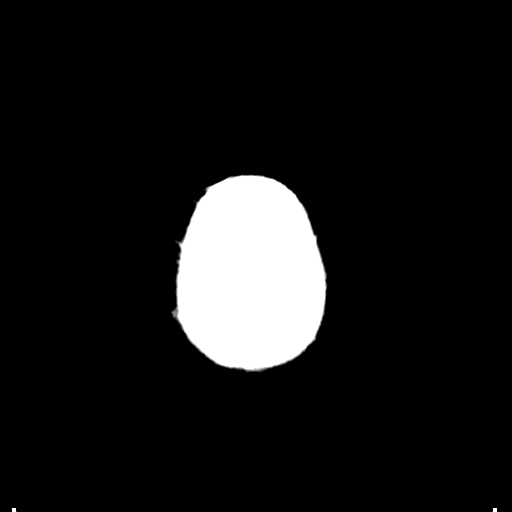
[im 29/32  bone]
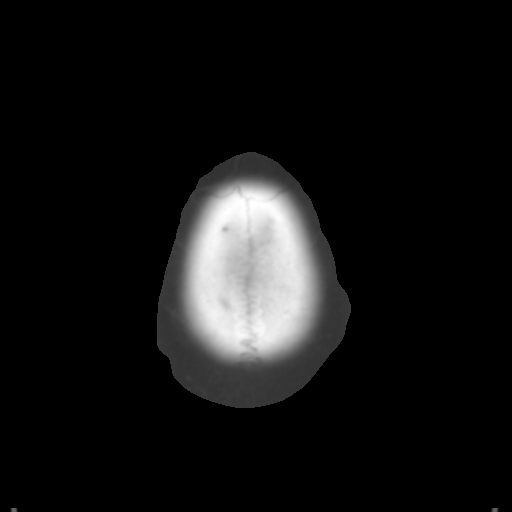

[Series 4: coronal soft tissue · coronal · 0.30mm/px · 3 of 76 slices shown]
[im 26/76  brain]
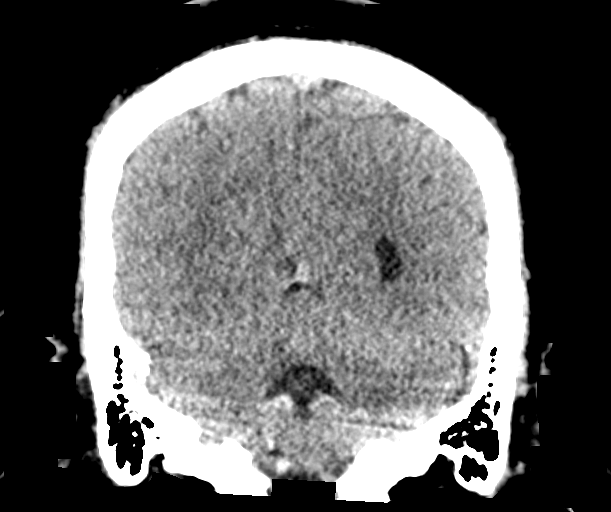
[im 34/76  brain]
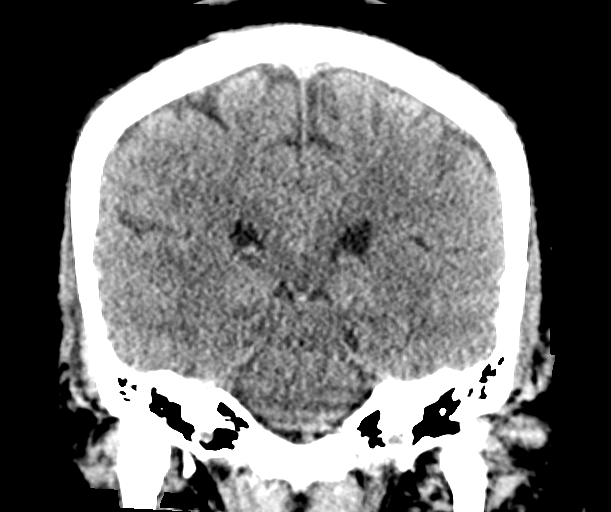
[im 42/76  brain]
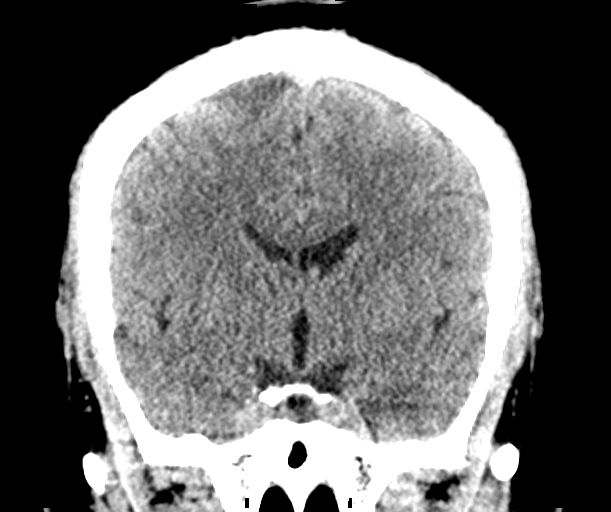

[Series 5: sagittal soft tissue · sagittal · 0.33mm/px · 3 of 57 slices shown]
[im 19/57  brain]
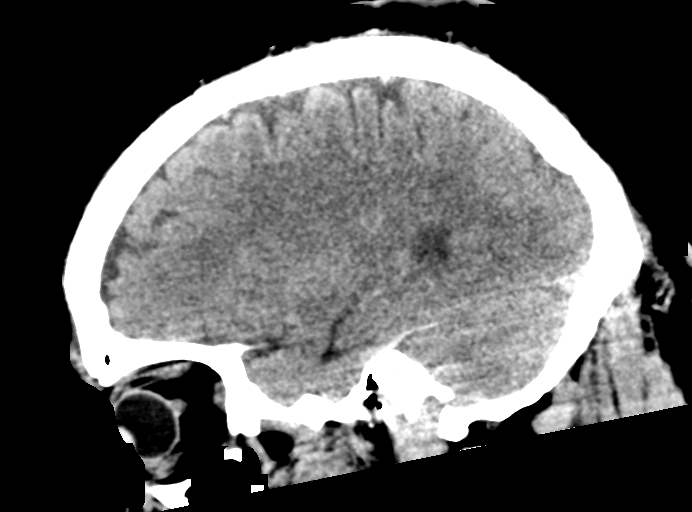
[im 29/57  brain]
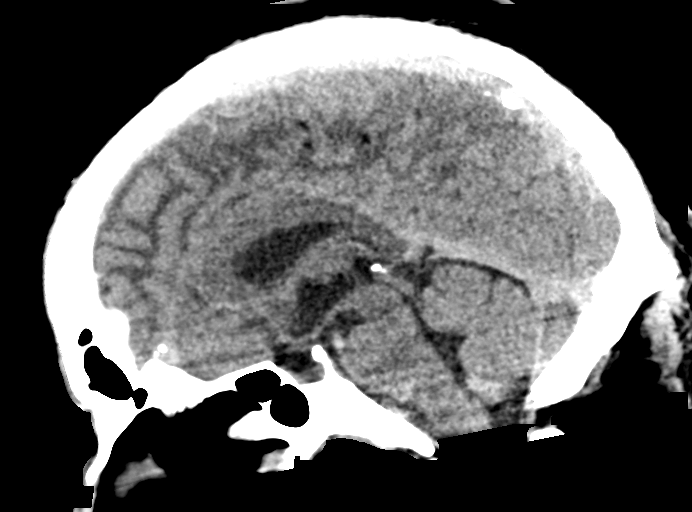
[im 38/57  brain]
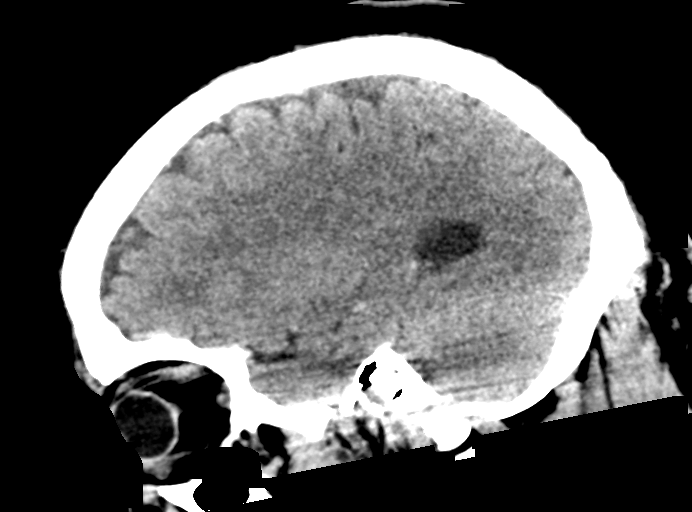

[15 of 47 positions shown; findings below may reference images not displayed]

FINDINGS: Brain: No evidence of acute infarction, hemorrhage, hydrocephalus,
extra-axial collection or mass lesion/mass effect.

Vascular: No hyperdense vessel or unexpected calcification.

Skull: Normal. Negative for fracture or focal lesion.

Sinuses/Orbits: Normal globes and orbits. Visualized sinuses and
mastoid air cells are clear.

Other: None.
IMPRESSION: Normal unenhanced CT scan of the brain.

## 2019-06-10 ENCOUNTER — Other Ambulatory Visit: Payer: Self-pay | Admitting: Family Medicine

## 2019-06-10 MED ORDER — METOPROLOL SUCCINATE ER 50 MG PO TB24: 50.0000 mg | ORAL_TABLET | Freq: Every day | ORAL | 5 refills | Status: DC

## 2019-06-10 NOTE — Telephone Encounter (Signed)
Medication Refill - Medication:  metoprolol succinate (TOPROL-XL) 50 MG 24 hr tablet [id number]   Has the patient contacted their pharmacy? Yes.   (Agent: If no, request that the patient contact the pharmacy for the refill.) (Agent: If yes, when and what did the pharmacy advise?)  Preferred Pharmacy (with phone number or street name): [place]   Agent: Please be advised that RX refills may take up to 3 business days. We ask that you follow-up with your pharmacy.

## 2019-11-17 ENCOUNTER — Other Ambulatory Visit: Payer: Self-pay | Admitting: Family Medicine

## 2019-12-17 ENCOUNTER — Other Ambulatory Visit: Payer: Self-pay | Admitting: Family Medicine

## 2019-12-17 DIAGNOSIS — K219 Gastro-esophageal reflux disease without esophagitis: Secondary | ICD-10-CM

## 2019-12-17 NOTE — Telephone Encounter (Signed)
>   3 months overdue- pt has upcoming appt 30 day courtesy RF given Requested Prescriptions  Pending Prescriptions Disp Refills   omeprazole (PRILOSEC) 40 MG capsule [Pharmacy Med Name: omeprazole 40 mg capsule,delayed release] 90 capsule 0    Sig: TAKE ONE CAPSULE BY MOUTH ONCE DAILY     Gastroenterology: Proton Pump Inhibitors Passed - 12/17/2019  8:01 AM      Passed - Valid encounter within last 12 months    Recent Outpatient Visits          11 months ago Annual physical exam   [facility] Birdie Sons, MD   1 year ago 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[facility])   [facility] Birdie Sons, MD   1 year ago Essential (primary) hypertension   [place], Kirstie Peri, MD   2 years ago Annual physical exam   [facility] Birdie Sons, MD   2 years ago Chronic back pain, unspecified back location, unspecified back pain laterality   [facility] Birdie Sons, MD      Future Appointments            In 1 month [place], Kirstie Peri, MD [facility]            metoprolol succinate (TOPROL-XL) 50 MG 24 hr tablet [Pharmacy Med Name: metoprolol succinate ER 50 mg tablet,extended release 24 hr] 30 tablet 0    Sig: TAKE ONE TABLET BY MOUTH ONCE DAILY TAKE WITH OR IMMEDIATELY FOLLOWING A MEAL     Cardiovascular:  Beta Blockers Failed - 12/17/2019  8:01 AM      Failed - Last BP in normal range    BP Readings from Last 1 Encounters:  01/07/19 (!) 142/80         Failed - Valid encounter within last 6 months    Recent Outpatient Visits          11 months ago Annual physical exam   [facility] Birdie Sons, MD   1 year ago 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[facility])   [facility] Birdie Sons, MD   1 year ago Essential (primary) hypertension   [place] [facility] Birdie Sons, MD   2 years ago Annual  physical exam   [facility] Birdie Sons, MD   2 years ago Chronic back pain, unspecified back location, unspecified back pain laterality   [facility] Birdie Sons, MD      Future Appointments            In 1 month Fisher, Kirstie Peri, MD [facility], [facility]           Passed - Last Heart Rate in normal range    Pulse Readings from Last 1 Encounters:  01/07/19 72

## 2020-01-16 ENCOUNTER — Other Ambulatory Visit: Payer: Self-pay | Admitting: Family Medicine

## 2020-01-16 MED ORDER — METOPROLOL SUCCINATE ER 50 MG PO TB24: ORAL_TABLET | ORAL | 0 refills | Status: DC

## 2020-01-16 NOTE — Telephone Encounter (Signed)
PT need a refill  metoprolol succinate (TOPROL-XL) 50 MG 24 hr tablet [[id number]]  [place], [place], [place] [address]  [place] [address]  Phone: [phone] Fax: [phone]

## 2020-01-16 NOTE — Telephone Encounter (Signed)
Requested medication (s) are due for refill today: yes  Requested medication (s) are on the active medication list: yes  Last refill:  12/17/2019  Future visit scheduled: yes  Notes to clinic:  Patient has appointment on 7/30/201 Already given 30 day courtesy    Requested Prescriptions  Pending Prescriptions Disp Refills   metoprolol succinate (TOPROL-XL) 50 MG 24 hr tablet 30 tablet 0    Sig: TAKE ONE TABLET BY MOUTH ONCE DAILY TAKE WITH OR IMMEDIATELY FOLLOWING A MEAL      Cardiovascular:  Beta Blockers Failed - 01/16/2020 11:18 AM      Failed - Last BP in normal range    BP Readings from Last 1 Encounters:  01/07/19 (!) 142/80          Failed - Valid encounter within last 6 months    Recent Outpatient Visits           1 year ago Annual physical exam   [facility] Birdie Sons, MD   1 year ago 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[facility])   [facility] Birdie Sons, MD   1 year ago Essential (primary) hypertension   [facility] Birdie Sons, MD   2 years ago Annual physical exam   [facility] Birdie Sons, MD   2 years ago Chronic back pain, unspecified back location, unspecified back pain laterality   [place], MD       Future Appointments             In 4 days Fisher, Kirstie Peri, MD [facility]            Passed - Last Heart Rate in normal range    Pulse Readings from Last 1 Encounters:  01/07/19 72

## 2020-01-16 NOTE — Telephone Encounter (Signed)
Requested medication (s) are due for refill today: YES  Requested medication (s) are on the active medication list: YES  Last refill:  12/17/2019  Future visit scheduled: YES  Notes to clinic:  PATIENT HAS UPCOMING APPOINTMENT ON 01/20/2020 REVIEW FOR ANOTHER SHORT SUPPLY   Requested Prescriptions  Pending Prescriptions Disp Refills   metoprolol succinate (TOPROL-XL) 50 MG 24 hr tablet [Pharmacy Med Name: metoprolol succinate ER 50 mg tablet,extended release 24 hr] 30 tablet 0    Sig: TAKE ONE TABLET BY MOUTH ONCE DAILY TAKE WITH OR IMMEDIATELY FOLLOWING A MEAL      Cardiovascular:  Beta Blockers Failed - 01/16/2020 11:07 AM      Failed - Last BP in normal range    BP Readings from Last 1 Encounters:  01/07/19 (!) 142/80          Failed - Valid encounter within last 6 months    Recent Outpatient Visits           1 year ago Annual physical exam   [facility] Birdie Sons, MD   1 year ago 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

## 2020-01-18 NOTE — Progress Notes (Signed)
Complete physical exam   Patient: Keith Villegas   DOB: 03/17/1956   64 y.o. Male  MRN: [id number] Visit Date: 01/20/2020  Today's healthcare provider: Lelon Huh, MD   Chief Complaint  Patient presents with  . Annual Exam   [employer] as a scribe for Lelon Huh, MD.,have documented all relevant documentation on the behalf of Lelon Huh, MD,as directed by  Lelon Huh, MD while in the presence of Lelon Huh, MD.  Subjective    Keith Villegas is a 64 y.o. male who presents today for a complete physical exam.  He reports consuming a general diet. The patient does not participate in regular exercise at present. He generally feels well. He reports sleeping well. He does not have additional problems to discuss today. He is planning on joining a gym in the coming weeks.  HPI  Hypertension, follow-up  BP Readings from Last 3 Encounters:  01/07/19 (!) 142/80  08/02/18 (!) 148/70  04/28/18 124/70   Wt Readings from Last 3 Encounters:  01/07/19 257 lb (116.6 kg)  08/02/18 255 lb (115.7 kg)  04/28/18 251 lb (113.9 kg)     He was last seen for hypertension 1 year ago.  BP at that visit was 142/80. Management since that visit includes continue same medication.  He reports good compliance with treatment. He is not having side effects.  He is following a Regular diet. He is not exercising. He does not smoke.  Use of agents associated with hypertension: NSAIDS.   Outside blood pressures are not being checked at home. Symptoms: No chest pain No chest pressure  No palpitations No syncope  No dyspnea No orthopnea  No paroxysmal nocturnal dyspnea No lower extremity edema   Pertinent labs: Lab Results  Component Value Date   CHOL 195 01/07/2019   HDL 44 01/07/2019   LDLCALC 113 (H) 01/07/2019   TRIG 189 (H) 01/07/2019   CHOLHDL 4.4 01/07/2019   Lab Results  Component Value Date   NA 140 01/07/2019   K 3.8 01/07/2019   CREATININE 1.07 01/07/2019     GFRNONAA 74 01/07/2019   GFRAA 86 01/07/2019   GLUCOSE 98 01/07/2019     The 10-year ASCVD risk score Mikey Bussing DC Jr., et al., 2013) is: 22.2%   ---------------------------------------------------------------------------------------------------  Lipid/Cholesterol, Follow-up  Last lipid panel Other pertinent labs  Lab Results  Component Value Date   CHOL 195 01/07/2019   HDL 44 01/07/2019   LDLCALC 113 (H) 01/07/2019   TRIG 189 (H) 01/07/2019   CHOLHDL 4.4 01/07/2019   Lab Results  Component Value Date   ALT 28 01/07/2019   AST 25 01/07/2019   PLT 218 01/07/2019   TSH 1.960 08/02/2018     He was last seen for this 1 year ago.  Management since that visit includes continue same medication.  He reports good compliance with treatment. He is not having side effects.   Symptoms: No chest pain No chest pressure/discomfort  No dyspnea No lower extremity edema  No numbness or tingling of extremity No orthopnea  No palpitations No paroxysmal nocturnal dyspnea  No speech difficulty No syncope   Current diet: in general, a "healthy" diet   Current exercise: none  The 10-year ASCVD risk score Mikey Bussing DC Brooke Bonito., et al., 2013) is: 22.2%  ---------------------------------------------------------------------------------------------------  Past Medical History:  Diagnosis Date  . Costochondritis 09/12/2015  . GERD (gastroesophageal reflux disease)   . Hypertension    Past Surgical History:  Procedure Laterality  Date  . APPENDECTOMY  1976  . COLONOSCOPY WITH PROPOFOL N/A 11/20/2016   Procedure: COLONOSCOPY WITH PROPOFOL;  Surgeon: Jonathon Bellows, MD;  Location: [facility] ENDOSCOPY;  Service: Endoscopy;  Laterality: N/A;  . echocardiogram  10/02/2010  . Myocardial perfusion scan  10/02/2010   normal perfusion. Normal systolic function. Borderline right ventricular dilation. EF>65%  . TOE SURGERY  2005   Social History   Socioeconomic History  . Marital status: Married    Spouse name:  Not on file  . Number of children: 2  . Years of education: Not on file  . Highest education level: Not on file  Occupational History  . Occupation: Visual merchandiser    Comment: works at [employer]  . Smoking status: Former Research scientist (life sciences)  . Smokeless tobacco: Never Used  Vaping Use  . Vaping Use: Never used  Substance and Sexual Activity  . Alcohol use: No    Alcohol/week: 0.0 standard drinks  . Drug use: No  . Sexual activity: Not on file  Other Topics Concern  . Not on file  Social History Narrative  . Not on file   Social Determinants of Health   Financial Resource Strain:   . Difficulty of Paying Living Expenses:   Food Insecurity:   . Worried About Charity fundraiser in the Last Year:   . Arboriculturist in the Last Year:   Transportation Needs:   . Film/video editor (Medical):   Marland Kitchen Lack of Transportation (Non-Medical):   Physical Activity:   . Days of Exercise per Week:   . Minutes of Exercise per Session:   Stress:   . Feeling of Stress :   Social Connections:   . Frequency of Communication with Friends and Family:   . Frequency of Social Gatherings with Friends and Family:   . Attends Religious Services:   . Active Member of Clubs or Organizations:   . Attends Archivist Meetings:   Marland Kitchen Marital Status:   Intimate Partner Violence:   . Fear of Current or Ex-Partner:   . Emotionally Abused:   Marland Kitchen Physically Abused:   . Sexually Abused:    Family Status  Relation Name Status  . Mother  Deceased       Cause of Death: COVID-19  . Father  Alive       87  . Sister  Alive  . Brother  Alive  . Brother  Alive   Family History  Problem Relation Age of Onset  . Cancer Mother        Dementia & Schizophrenia  . CAD Father    No Known Allergies  Patient Care Team: Birdie Sons, MD as PCP - General (Family Medicine) Garrel Ridgel, DPM as Consulting Physician (Podiatry) System, Provider Not In Edrick Kins, DPM as Consulting  Physician (Podiatry) Johnnette Gourd, MD (Physical Medicine and Rehabilitation)   Medications: Outpatient Medications Prior to Visit  Medication Sig  . aspirin 81 MG tablet Take 81 mg by mouth daily.   . benazepril-hydrochlorthiazide (LOTENSIN HCT) 20-12.5 MG tablet TAKE ONE TABLET BY MOUTH ONCE DAILY  . hydrocortisone-pramoxine (ANALPRAM HC) 2.5-1 % rectal cream Place 1 application rectally 3 (three) times daily.  . metoprolol succinate (TOPROL-XL) 50 MG 24 hr tablet TAKE ONE TABLET BY MOUTH ONCE DAILY TAKE WITH OR IMMEDIATELY FOLLOWING A MEAL  . Multiple Vitamin (MULTIVITAMIN) capsule Take 1 capsule by mouth daily.  Marland Kitchen omeprazole (PRILOSEC) 40 MG capsule TAKE  ONE CAPSULE BY MOUTH ONCE DAILY  . rosuvastatin (CRESTOR) 10 MG tablet TAKE ONE TABLET BY MOUTH ONCE DAILY   No facility-administered medications prior to visit.    Review of Systems  Constitutional: Negative for appetite change, chills and fever.  Respiratory: Negative for chest tightness, shortness of breath and wheezing.   Cardiovascular: Negative for chest pain and palpitations.  Gastrointestinal: Negative for abdominal pain, nausea and vomiting.      Objective    BP (!) 153/74 (BP Location: Right Arm, Patient Position: Sitting, Cuff Size: Large)   Pulse 68   Temp 98.1 F (36.7 C) (Oral)   Ht 5\' 9"  (1.753 m)   Wt (!) 260 lb 3.2 oz (118 kg)   BMI 38.42 kg/m    Physical Exam   General Appearance:    Obese male. Alert, cooperative, in no acute distress, appears stated age  Head:    Normocephalic, without obvious abnormality, atraumatic  Eyes:    PERRL, conjunctiva/corneas clear, EOM's intact, fundi    benign, both eyes       Ears:    Normal TM's and external ear canals, both ears  Neck:   Supple, symmetrical, trachea midline, no adenopathy;       thyroid:  No enlargement/tenderness/nodules; no carotid   bruit or JVD  Back:     Symmetric, no curvature, ROM normal, no CVA tenderness  Lungs:     Clear to  auscultation bilaterally, respirations unlabored  Chest wall:    No tenderness or deformity  Heart:    Normal heart rate. Normal rhythm. No murmurs, rubs, or gallops.  S1 and S2 normal  Abdomen:     Soft, non-tender, bowel sounds active all four quadrants,    no masses, no organomegaly  Genitalia:    deferred  Rectal:    deferred  Extremities:   All extremities are intact. No cyanosis or edema  Pulses:   2+ and symmetric all extremities  Skin:   Skin color, texture, turgor normal, no rashes or lesions  Lymph nodes:   Cervical, supraclavicular, and axillary nodes normal  Neurologic:   CNII-XII intact. Normal strength, sensation and reflexes      throughout     Last depression screening scores PHQ 2/9 Scores 01/07/2019 10/30/2017 10/21/2016  PHQ - 2 Score 0 0 1  PHQ- 9 Score 0 0 2   Last fall risk screening Fall Risk  10/30/2017  Falls in the past year? No   Last Audit-C alcohol use screening No flowsheet data found. A score of 3 or more in women, and 4 or more in men indicates increased risk for alcohol abuse, EXCEPT if all of the points are from question 1   No results found for any visits on 01/20/20.  Assessment & Plan    Routine Health Maintenance and Physical Exam  Exercise Activities and Dietary recommendations Goals   None     Immunization History  Administered Date(s) Administered  . Influenza Split 06/07/2011  . Influenza-Unspecified 04/10/2017, 04/07/2018  . Janssen (J&J) SARS-COV-2 Vaccination 08/29/2019  . Td 10/30/2017  . Tdap 09/21/2007    Health Maintenance  Topic Date Due  . HIV Screening  Never done  . INFLUENZA VACCINE  01/22/2020  . COLONOSCOPY  11/20/2021  . TETANUS/TDAP  10/31/2027  . COVID-19 Vaccine  Completed  . Hepatitis C Screening  Completed    Discussed health benefits of physical activity, and encouraged him to engage in regular exercise appropriate for his age and condition.  1.  Annual physical exam Generally doing well. Has been  somewhat depressed since his mother passed away last year, but has been seeing a Social worker and is doing better.   2. Essential (primary) hypertension He did not take his BP medications today and reports home BP is usually in the 130s/80s. Continue current medications.   - CBC - Lipid panel - TSH - Comprehensive metabolic panel - EKG 16-SAYT  3. Hyperlipidemia, unspecified hyperlipidemia type He is tolerating rosuvastatin well with no adverse effects.    4. Encounter for screening for HIV  - HIV Antibody (routine testing w rflx)  5. Prostate cancer screening  - PSA     The entirety of the information documented in the History of Present Illness, Review of Systems and Physical Exam were personally obtained by me. Portions of this information were initially documented by the CMA and reviewed by me for thoroughness and accuracy.      Lelon Huh, MD  [facility] [phone] (phone) [phone] (fax)  [place]

## 2020-01-20 ENCOUNTER — Ambulatory Visit (INDEPENDENT_AMBULATORY_CARE_PROVIDER_SITE_OTHER): Payer: BC Managed Care – PPO | Admitting: Family Medicine

## 2020-01-20 ENCOUNTER — Other Ambulatory Visit: Payer: Self-pay

## 2020-01-20 ENCOUNTER — Encounter: Payer: Self-pay | Admitting: Family Medicine

## 2020-01-20 VITALS — BP 153/74 | HR 68 | Temp 98.1°F | Ht 69.0 in | Wt 260.2 lb

## 2020-01-20 DIAGNOSIS — Z125 Encounter for screening for malignant neoplasm of prostate: Secondary | ICD-10-CM

## 2020-01-20 DIAGNOSIS — Z114 Encounter for screening for human immunodeficiency virus [HIV]: Secondary | ICD-10-CM

## 2020-01-20 DIAGNOSIS — Z0000 Encounter for general adult medical examination without abnormal findings: Secondary | ICD-10-CM | POA: Diagnosis not present

## 2020-01-20 DIAGNOSIS — E785 Hyperlipidemia, unspecified: Secondary | ICD-10-CM | POA: Diagnosis not present

## 2020-01-20 DIAGNOSIS — I10 Essential (primary) hypertension: Secondary | ICD-10-CM | POA: Diagnosis not present

## 2020-01-20 NOTE — Patient Instructions (Signed)
.   Please review the attached list of medications and notify my office if there are any errors.   . Please bring all of your medications to every appointment so we can make sure that our medication list is the same as yours.   

## 2020-01-21 LAB — COMPREHENSIVE METABOLIC PANEL
ALT: 23 IU/L (ref 0–44)
AST: 24 IU/L (ref 0–40)
Albumin/Globulin Ratio: 1.9 (ref 1.2–2.2)
Albumin: 4.9 g/dL — ABNORMAL HIGH (ref 3.8–4.8)
Alkaline Phosphatase: 109 IU/L (ref 48–121)
BUN/Creatinine Ratio: 12 (ref 10–24)
BUN: 14 mg/dL (ref 8–27)
Bilirubin Total: 0.5 mg/dL (ref 0.0–1.2)
CO2: 25 mmol/L (ref 20–29)
Calcium: 9.9 mg/dL (ref 8.6–10.2)
Chloride: 100 mmol/L (ref 96–106)
Creatinine, Ser: 1.18 mg/dL (ref 0.76–1.27)
GFR calc Af Amer: 75 mL/min/{1.73_m2} (ref >59)
GFR calc non Af Amer: 65 mL/min/{1.73_m2} (ref >59)
Globulin, Total: 2.6 g/dL (ref 1.5–4.5)
Glucose: 99 mg/dL (ref 65–99)
Potassium: 4.1 mmol/L (ref 3.5–5.2)
Sodium: 140 mmol/L (ref 134–144)
Total Protein: 7.5 g/dL (ref 6.0–8.5)

## 2020-01-21 LAB — CBC
Hematocrit: 46.4 % (ref 37.5–51.0)
Hemoglobin: 15.6 g/dL (ref 13.0–17.7)
MCH: 28.7 pg (ref 26.6–33.0)
MCHC: 33.6 g/dL (ref 31.5–35.7)
MCV: 85 fL (ref 79–97)
Platelets: 238 10*3/uL (ref 150–450)
RBC: 5.44 x10E6/uL (ref 4.14–5.80)
RDW: 13.6 % (ref 11.6–15.4)
WBC: 8.8 10*3/uL (ref 3.4–10.8)

## 2020-01-21 LAB — LIPID PANEL
Chol/HDL Ratio: 4.5 ratio (ref 0.0–5.0)
Cholesterol, Total: 197 mg/dL (ref 100–199)
HDL: 44 mg/dL (ref >39)
LDL Chol Calc (NIH): 122 mg/dL — ABNORMAL HIGH (ref 0–99)
Triglycerides: 176 mg/dL — ABNORMAL HIGH (ref 0–149)
VLDL Cholesterol Cal: 31 mg/dL (ref 5–40)

## 2020-01-21 LAB — PSA: Prostate Specific Ag, Serum: 0.2 ng/mL (ref 0.0–4.0)

## 2020-01-21 LAB — HIV ANTIBODY (ROUTINE TESTING W REFLEX): HIV Screen 4th Generation wRfx: NONREACTIVE

## 2020-01-21 LAB — TSH: TSH: 1.14 u[IU]/mL (ref 0.450–4.500)

## 2020-02-20 ENCOUNTER — Other Ambulatory Visit: Payer: Self-pay | Admitting: Family Medicine

## 2020-02-20 NOTE — Telephone Encounter (Signed)
Patient stated that he has been without medication for 2 days now and would like his request expedited.

## 2020-03-16 ENCOUNTER — Other Ambulatory Visit: Payer: Self-pay | Admitting: Family Medicine

## 2020-03-16 DIAGNOSIS — K219 Gastro-esophageal reflux disease without esophagitis: Secondary | ICD-10-CM

## 2020-03-16 NOTE — Telephone Encounter (Addendum)
Pt upset his med not at the pharmacy. Pt had his cpe 01/20/20. Pt states his meds were supposed to be filled for a year. He needs the  omeprazole (PRILOSEC) 40 MG capsule   [place], [place] Phone:  [phone]  Fax:  [phone]

## 2020-03-16 NOTE — Telephone Encounter (Signed)
Requested Prescriptions  Pending Prescriptions Disp Refills  . omeprazole (PRILOSEC) 40 MG capsule [Pharmacy Med Name: omeprazole 40 mg capsule,delayed release] 90 capsule 3    Sig: TAKE ONE CAPSULE BY MOUTH ONCE DAILY     Gastroenterology: Proton Pump Inhibitors Passed - 03/16/2020 12:19 PM      Passed - Valid encounter within last 12 months    Recent Outpatient Visits          1 month ago Annual physical exam   [facility] Birdie Sons, MD   1 year ago Annual physical exam   [facility] Birdie Sons, MD   1 year ago 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[facility])   [facility] Birdie Sons, MD   1 year ago Essential (primary) hypertension   [facility] Birdie Sons, MD   2 years ago Annual physical exam   [facility] Birdie Sons, MD

## 2020-04-09 ENCOUNTER — Ambulatory Visit: Payer: BC Managed Care – PPO | Admitting: Adult Health

## 2020-04-09 ENCOUNTER — Other Ambulatory Visit: Payer: Self-pay | Admitting: Adult Health

## 2020-04-09 ENCOUNTER — Other Ambulatory Visit: Payer: Self-pay

## 2020-04-09 ENCOUNTER — Encounter: Payer: Self-pay | Admitting: Adult Health

## 2020-04-09 VITALS — BP 151/90 | HR 89 | Temp 98.5°F | Wt 251.0 lb

## 2020-04-09 DIAGNOSIS — I10 Essential (primary) hypertension: Secondary | ICD-10-CM

## 2020-04-09 DIAGNOSIS — H6123 Impacted cerumen, bilateral: Secondary | ICD-10-CM | POA: Diagnosis not present

## 2020-04-09 DIAGNOSIS — R42 Dizziness and giddiness: Secondary | ICD-10-CM | POA: Insufficient documentation

## 2020-04-09 NOTE — Patient Instructions (Addendum)
Earwax Buildup, Adult The ears produce a substance called earwax that helps keep bacteria out of the ear and protects the skin in the ear canal. Occasionally, earwax can build up in the ear and cause discomfort or hearing loss. What increases the risk? This condition is more likely to develop in people who:  Are male.  Are elderly.  Naturally produce more earwax.  Clean their ears often with cotton swabs.  Use earplugs often.  Use in-ear headphones often.  Wear hearing aids.  Have narrow ear canals.  Have earwax that is overly thick or sticky.  Have eczema.  Are dehydrated.  Have excess hair in the ear canal. What are the signs or symptoms? Symptoms of this condition include:  Reduced or muffled hearing.  A feeling of fullness in the ear or feeling that the ear is plugged.  Fluid coming from the ear.  Ear pain.  Ear itch.  Ringing in the ear.  Coughing.  An obvious piece of earwax that can be seen inside the ear canal. How is this diagnosed? This condition may be diagnosed based on:  Your symptoms.  Your medical history.  An ear exam. During the exam, your health care provider will look into your ear with an instrument called an otoscope. You may have tests, including a hearing test. How is this treated? This condition may be treated by:  Using ear drops to soften the earwax.  Having the earwax removed by a health care provider. The health care provider may: ? Flush the ear with water. ? Use an instrument that has a loop on the end (curette). ? Use a suction device.  Surgery to remove the wax buildup. This may be done in severe cases. Follow these instructions at home:   Take over-the-counter and prescription medicines only as told by your health care provider.  Do not put any objects, including cotton swabs, into your ear. You can clean the opening of your ear canal with a washcloth or facial tissue.  Follow instructions from your health care  provider about cleaning your ears. Do not over-clean your ears.  Drink enough fluid to keep your urine clear or pale yellow. This will help to thin the earwax.  Keep all follow-up visits as told by your health care provider. If earwax builds up in your ears often or if you use hearing aids, consider seeing your health care provider for routine, preventive ear cleanings. Ask your health care provider how often you should schedule your cleanings.  If you have hearing aids, clean them according to instructions from the manufacturer and your health care provider. Contact a health care provider if:  You have ear pain.  You develop a fever.  You have blood, pus, or other fluid coming from your ear.  You have hearing loss.  You have ringing in your ears that does not go away.  Your symptoms do not improve with treatment.  You feel like the room is spinning (vertigo). Summary  Earwax can build up in the ear and cause discomfort or hearing loss.  The most common symptoms of this condition include reduced or muffled hearing and a feeling of fullness in the ear or feeling that the ear is plugged.  This condition may be diagnosed based on your symptoms, your medical history, and an ear exam.  This condition may be treated by using ear drops to soften the earwax or by having the earwax removed by a health care provider.  Do not put any   objects, including cotton swabs, into your ear. You can clean the opening of your ear canal with a washcloth or facial tissue. This information is not intended to replace advice given to you by your health care provider. Make sure you discuss any questions you have with your health care provider. Document Revised: 05/22/2017 Document Reviewed: 08/20/2016 Elsevier Patient Education  [place]. Vertigo Vertigo is the feeling that you or the things around you are moving when they are not. This feeling can come and go at any time. Vertigo often goes away  on its own. This condition can be dangerous if it happens when you are doing activities like driving or working with machines. Your doctor will do tests to find the cause of your vertigo. These tests will also help your doctor decide on the best treatment for you. Follow these instructions at home: Eating and drinking      Drink enough fluid to keep your pee (urine) pale yellow.  Do not drink alcohol. Activity  Return to your normal activities as told by your doctor. Ask your doctor what activities are safe for you.  In the morning, first sit up on the side of the bed. When you feel okay, stand slowly while you hold onto something until you know that your balance is fine.  Move slowly. Avoid sudden body or head movements or certain positions, as told by your doctor.  Use a cane if you have trouble standing or walking.  Sit down right away if you feel dizzy.  Avoid doing any tasks or activities that can cause danger to you or others if you get dizzy.  Avoid bending down if you feel dizzy. Place items in your home so that they are easy for you to reach without leaning over.  Do not drive or use heavy machinery if you feel dizzy. General instructions  Take over-the-counter and prescription medicines only as told by your doctor.  Keep all follow-up visits as told by your doctor. This is important. Contact a doctor if:  Your medicine does not help your vertigo.  You have a fever.  Your problems get worse or you have new symptoms.  Your family or friends see changes in your behavior.  The feeling of being sick to your stomach gets worse.  Your vomiting gets worse.  You lose feeling (have numbness) in part of your body.  You feel prickling and tingling in a part of your body. Get help right away if:  You have trouble moving or talking.  You are always dizzy.  You pass out (faint).  You get very bad headaches.  You feel weak in your hands, arms, or legs.  You  have changes in your hearing.  You have changes in how you see (vision).  You get a stiff neck.  Bright light starts to bother you. Summary  Vertigo is the feeling that you or the things around you are moving when they are not.  Your doctor will do tests to find the cause of your vertigo.  You may be told to avoid some tasks, positions, or movements.  Contact a doctor if your medicine is not helping, or if you have a fever, new symptoms, or a change in behavior.  Get help right away if you get very bad headaches, or if you have changes in how you speak, hear, or see. This information is not intended to replace advice given to you by your health care provider. Make sure you discuss any  questions you have with your health care provider. Document Revised: 05/03/2018 Document Reviewed: 05/03/2018 Elsevier Patient Education  [place]. Blood Pressure Record Sheet To take your blood pressure, you will need a blood pressure machine. You can buy a blood pressure machine (blood pressure monitor) at your clinic, drug store, or online. When choosing one, consider:  An automatic monitor that has an arm cuff.  A cuff that wraps snugly around your upper arm. You should be able to fit only one finger between your arm and the cuff.  A device that stores blood pressure reading results.  Do not choose a monitor that measures your blood pressure from your wrist or finger. Follow your health care provider's instructions for how to take your blood pressure. To use this form:  Get one reading in the morning (a.m.) before you take any medicines.  Get one reading in the evening (p.m.) before supper.  Take at least 2 readings with each blood pressure check. This makes sure the results are correct. Wait 1-2 minutes between measurements.  Write down the results in the spaces on this form.  Repeat this once a week, or as told by your health care provider.  Make a follow-up appointment with  your health care provider to discuss the results. Blood pressure log Date: _______________________  a.m. _____________________(1st reading) _____________________(2nd reading)  p.m. _____________________(1st reading) _____________________(2nd reading) Date: _______________________  a.m. _____________________(1st reading) _____________________(2nd reading)  p.m. _____________________(1st reading) _____________________(2nd reading) Date: _______________________  a.m. _____________________(1st reading) _____________________(2nd reading)  p.m. _____________________(1st reading) _____________________(2nd reading) Date: _______________________  a.m. _____________________(1st reading) _____________________(2nd reading)  p.m. _____________________(1st reading) _____________________(2nd reading) Date: _______________________  a.m. _____________________(1st reading) _____________________(2nd reading)  p.m. _____________________(1st reading) _____________________(2nd reading) This information is not intended to replace advice given to you by your health care provider. Make sure you discuss any questions you have with your health care provider. Document Revised: 08/07/2017 Document Reviewed: 06/09/2017 Elsevier Patient Education  [place].   Hypertension, Adult High blood pressure (hypertension) is when the force of blood pumping through the arteries is too strong. The arteries are the blood vessels that carry blood from the heart throughout the body. Hypertension forces the heart to work harder to pump blood and may cause arteries to become narrow or stiff. Untreated or uncontrolled hypertension can cause a heart attack, heart failure, a stroke, kidney disease, and other problems. A blood pressure reading consists of a higher number over a lower number. Ideally, your blood pressure should be below 120/80. The first ("top") number is called the systolic pressure. It is a measure of the  pressure in your arteries as your heart beats. The second ("bottom") number is called the diastolic pressure. It is a measure of the pressure in your arteries as the heart relaxes. What are the causes? The exact cause of this condition is not known. There are some conditions that result in or are related to high blood pressure. What increases the risk? Some risk factors for high blood pressure are under your control. The following factors may make you more likely to develop this condition:  Smoking.  Having type 2 diabetes mellitus, high cholesterol, or both.  Not getting enough exercise or physical activity.  Being overweight.  Having too much fat, sugar, calories, or salt (sodium) in your diet.  Drinking too much alcohol. Some risk factors for high blood pressure may be difficult or impossible to change. Some of these factors include:  Having chronic kidney disease.  Having a family history  of high blood pressure.  Age. Risk increases with age.  Race. You may be at higher risk if you are African American.  Gender. Men are at higher risk than women before age 38. After age 37, women are at higher risk than men.  Having obstructive sleep apnea.  Stress. What are the signs or symptoms? High blood pressure may not cause symptoms. Very high blood pressure (hypertensive crisis) may cause:  Headache.  Anxiety.  Shortness of breath.  Nosebleed.  Nausea and vomiting.  Vision changes.  Severe chest pain.  Seizures. How is this diagnosed? This condition is diagnosed by measuring your blood pressure while you are seated, with your arm resting on a flat surface, your legs uncrossed, and your feet flat on the floor. The cuff of the blood pressure monitor will be placed directly against the skin of your upper arm at the level of your heart. It should be measured at least twice using the same arm. Certain conditions can cause a difference in blood pressure between your right and  left arms. Certain factors can cause blood pressure readings to be lower or higher than normal for a short period of time:  When your blood pressure is higher when you are in a health care provider's office than when you are at home, this is called white coat hypertension. Most people with this condition do not need medicines.  When your blood pressure is higher at home than when you are in a health care provider's office, this is called masked hypertension. Most people with this condition may need medicines to control blood pressure. If you have a high blood pressure reading during one visit or you have normal blood pressure with other risk factors, you may be asked to:  Return on a different day to have your blood pressure checked again.  Monitor your blood pressure at home for 1 week or longer. If you are diagnosed with hypertension, you may have other blood or imaging tests to help your health care provider understand your overall risk for other conditions. How is this treated? This condition is treated by making healthy lifestyle changes, such as eating healthy foods, exercising more, and reducing your alcohol intake. Your health care provider may prescribe medicine if lifestyle changes are not enough to get your blood pressure under control, and if:  Your systolic blood pressure is above 130.  Your diastolic blood pressure is above 80. Your personal target blood pressure may vary depending on your medical conditions, your age, and other factors. Follow these instructions at home: Eating and drinking   Eat a diet that is high in fiber and potassium, and low in sodium, added sugar, and fat. An example eating plan is called the DASH (Dietary Approaches to Stop Hypertension) diet. To eat this way: ? Eat plenty of fresh fruits and vegetables. Try to fill one half of your plate at each meal with fruits and vegetables. ? Eat whole grains, such as whole-wheat pasta, brown rice, or whole-grain  bread. Fill about one fourth of your plate with whole grains. ? Eat or drink low-fat dairy products, such as skim milk or low-fat yogurt. ? Avoid fatty cuts of meat, processed or cured meats, and poultry with skin. Fill about one fourth of your plate with lean proteins, such as fish, chicken without skin, beans, eggs, or tofu. ? Avoid pre-made and processed foods. These tend to be higher in sodium, added sugar, and fat.  Reduce your daily sodium intake. Most people with  hypertension should eat less than 1,500 mg of sodium a day.  Do not drink alcohol if: ? Your health care provider tells you not to drink. ? You are pregnant, may be pregnant, or are planning to become pregnant.  If you drink alcohol: ? Limit how much you use to:  0-1 drink a day for women.  0-2 drinks a day for men. ? Be aware of how much alcohol is in your drink. In the [place] one drink equals one 12 oz bottle of beer (355 mL), one 5 oz [place] of wine (148 mL), or one 1 oz Guess of hard liquor (44 mL). Lifestyle   Work with your health care provider to maintain a healthy body weight or to lose weight. Ask what an ideal weight is for you.  Get at least 30 minutes of exercise most days of the week. Activities may include walking, swimming, or biking.  Include exercise to strengthen your muscles (resistance exercise), such as Pilates or lifting weights, as part of your weekly exercise routine. Try to do these types of exercises for 30 minutes at least 3 days a week.  Do not use any products that contain nicotine or tobacco, such as cigarettes, e-cigarettes, and chewing tobacco. If you need help quitting, ask your health care provider.  Monitor your blood pressure at home as told by your health care provider.  Keep all follow-up visits as told by your health care provider. This is important. Medicines  Take over-the-counter and prescription medicines only as told by your health care provider. Follow directions carefully.  Blood pressure medicines must be taken as prescribed.  Do not skip doses of blood pressure medicine. Doing this puts you at risk for problems and can make the medicine less effective.  Ask your health care provider about side effects or reactions to medicines that you should watch for. Contact a health care provider if you:  Think you are having a reaction to a medicine you are taking.  Have headaches that keep coming back (recurring).  Feel dizzy.  Have swelling in your ankles.  Have trouble with your vision. Get help right away if you:  Develop a severe headache or confusion.  Have unusual weakness or numbness.  Feel faint.  Have severe pain in your chest or abdomen.  Vomit repeatedly.  Have trouble breathing. Summary  Hypertension is when the force of blood pumping through your arteries is too strong. If this condition is not controlled, it may put you at risk for serious complications.  Your personal target blood pressure may vary depending on your medical conditions, your age, and other factors. For most people, a normal blood pressure is less than 120/80.  Hypertension is treated with lifestyle changes, medicines, or a combination of both. Lifestyle changes include losing weight, eating a healthy, low-sodium diet, exercising more, and limiting alcohol. This information is not intended to replace advice given to you by your health care provider. Make sure you discuss any questions you have with your health care provider. Document Revised: 02/17/2018 Document Reviewed: 02/17/2018 Elsevier Patient Education  [place].   Low-Sodium Eating Plan Sodium, which is an element that makes up salt, helps you maintain a healthy balance of fluids in your body. Too much sodium can increase your blood pressure and cause fluid and waste to be held in your body. Your health care provider or dietitian may recommend following this plan if you have high blood pressure  (hypertension), kidney disease, liver disease, or heart  failure. Eating less sodium can help lower your blood pressure, reduce swelling, and protect your heart, liver, and kidneys. What are tips for following this plan? General guidelines  Most people on this plan should limit their sodium intake to 1,500-2,000 mg (milligrams) of sodium each day. Reading food labels   The Nutrition Facts label lists the amount of sodium in one serving of the food. If you eat more than one serving, you must multiply the listed amount of sodium by the number of servings.  Choose foods with less than 140 mg of sodium per serving.  Avoid foods with 300 mg of sodium or more per serving. Shopping  Look for lower-sodium products, often labeled as "low-sodium" or "no salt added."  Always check the sodium content even if foods are labeled as "unsalted" or "no salt added".  Buy fresh foods. ? Avoid canned foods and premade or frozen meals. ? Avoid canned, cured, or processed meats  Buy breads that have less than 80 mg of sodium per slice. Cooking  Eat more home-cooked food and less restaurant, buffet, and fast food.  Avoid adding salt when cooking. Use salt-free seasonings or herbs instead of table salt or sea salt. Check with your health care provider or pharmacist before using salt substitutes.  Cook with plant-based oils, such as canola, sunflower, or olive oil. Meal planning  When eating at a restaurant, ask that your food be prepared with less salt or no salt, if possible.  Avoid foods that contain MSG (monosodium glutamate). MSG is sometimes added to [place] food, bouillon, and some canned foods. What foods are recommended? The items listed may not be a complete list. Talk with your dietitian about what dietary choices are best for you. Grains Low-sodium cereals, including oats, puffed wheat and rice, and shredded wheat. Low-sodium crackers. Unsalted rice. Unsalted pasta. Low-sodium bread.  Whole-grain breads and whole-grain pasta. Vegetables Fresh or frozen vegetables. "No salt added" canned vegetables. "No salt added" tomato sauce and paste. Low-sodium or reduced-sodium tomato and vegetable juice. Fruits Fresh, frozen, or canned fruit. Fruit juice. Meats and other protein foods Fresh or frozen (no salt added) meat, poultry, seafood, and fish. Low-sodium canned tuna and salmon. Unsalted nuts. Dried peas, beans, and lentils without added salt. Unsalted canned beans. Eggs. Unsalted nut butters. Dairy Milk. Soy milk. Cheese that is naturally low in sodium, such as ricotta cheese, fresh mozzarella, or Swiss cheese Low-sodium or reduced-sodium cheese. Cream cheese. Yogurt. Fats and oils Unsalted butter. Unsalted margarine with no trans fat. Vegetable oils such as canola or olive oils. Seasonings and other foods Fresh and dried herbs and spices. Salt-free seasonings. Low-sodium mustard and ketchup. Sodium-free salad dressing. Sodium-free light mayonnaise. Fresh or refrigerated horseradish. Lemon juice. Vinegar. Homemade, reduced-sodium, or low-sodium soups. Unsalted popcorn and pretzels. Low-salt or salt-free chips. What foods are not recommended? The items listed may not be a complete list. Talk with your dietitian about what dietary choices are best for you. Grains Instant hot cereals. Bread stuffing, pancake, and biscuit mixes. Croutons. Seasoned rice or pasta mixes. Noodle soup cups. Boxed or frozen macaroni and cheese. Regular salted crackers. Self-rising flour. Vegetables Sauerkraut, pickled vegetables, and relishes. Olives. [place] fries. Onion rings. Regular canned vegetables (not low-sodium or reduced-sodium). Regular canned tomato sauce and paste (not low-sodium or reduced-sodium). Regular tomato and vegetable juice (not low-sodium or reduced-sodium). Frozen vegetables in sauces. Meats and other protein foods Meat or fish that is salted, canned, smoked, spiced, or pickled.  Bacon, ham, sausage, hotdogs, corned  beef, chipped beef, packaged lunch meats, salt pork, jerky, pickled herring, anchovies, regular canned tuna, sardines, salted nuts. Dairy Processed cheese and cheese spreads. Cheese curds. Blue cheese. Feta cheese. String cheese. Regular cottage cheese. Buttermilk. Canned milk. Fats and oils Salted butter. Regular margarine. Ghee. Bacon fat. Seasonings and other foods Onion salt, garlic salt, seasoned salt, table salt, and sea salt. Canned and packaged gravies. Worcestershire sauce. Tartar sauce. Barbecue sauce. Teriyaki sauce. Soy sauce, including reduced-sodium. Steak sauce. Fish sauce. Oyster sauce. Cocktail sauce. Horseradish that you find on the shelf. Regular ketchup and mustard. Meat flavorings and tenderizers. Bouillon cubes. Hot sauce and Tabasco sauce. Premade or packaged marinades. Premade or packaged taco seasonings. Relishes. Regular salad dressings. Salsa. Potato and tortilla chips. Corn chips and puffs. Salted popcorn and pretzels. Canned or dried soups. Pizza. Frozen entrees and pot pies. Summary  Eating less sodium can help lower your blood pressure, reduce swelling, and protect your heart, liver, and kidneys.  Most people on this plan should limit their sodium intake to 1,500-2,000 mg (milligrams) of sodium each day.  Canned, boxed, and frozen foods are high in sodium. Restaurant foods, fast foods, and pizza are also very high in sodium. You also get sodium by adding salt to food.  Try to cook at home, eat more fresh fruits and vegetables, and eat less fast food, canned, processed, or prepared foods. This information is not intended to replace advice given to you by your health care provider. Make sure you discuss any questions you have with your health care provider. Document Revised: 05/22/2017 Document Reviewed: 06/02/2016 Elsevier Patient Education  2020 [employer].     Why follow it? Research shows. . Those who follow the  Mediterranean diet have a reduced risk of heart disease  . The diet is associated with a reduced incidence of Parkinson's and Alzheimer's diseases . People following the diet may have longer life expectancies and lower rates of chronic diseases  . The Dietary Guidelines for Americans recommends the Mediterranean diet as an eating plan to promote health and prevent disease  What Is the Mediterranean Diet?  . Healthy eating plan based on typical foods and recipes of Mediterranean-style cooking . The diet is primarily a plant based diet; these foods should make up a majority of meals   Starches - Plant based foods should make up a majority of meals - They are an important sources of vitamins, minerals, energy, antioxidants, and fiber - Choose whole grains, foods high in fiber and minimally processed items  - Typical grain sources include wheat, oats, barley, corn, brown rice, bulgar, farro, millet, polenta, couscous  - Various types of beans include chickpeas, lentils, fava beans, black beans, white beans   Fruits  Veggies - Large quantities of antioxidant rich fruits & veggies; 6 or more servings  - Vegetables can be eaten raw or lightly drizzled with oil and cooked  - Vegetables common to the traditional Mediterranean Diet include: artichokes, arugula, beets, broccoli, brussel sprouts, cabbage, carrots, celery, collard greens, cucumbers, eggplant, kale, leeks, lemons, lettuce, mushrooms, okra, onions, peas, peppers, potatoes, pumpkin, radishes, rutabaga, shallots, spinach, sweet potatoes, turnips, zucchini - Fruits common to the Mediterranean Diet include: apples, apricots, avocados, cherries, clementines, dates, figs, grapefruits, grapes, melons, nectarines, oranges, peaches, pears, pomegranates, strawberries, tangerines  Fats - Replace butter and margarine with healthy oils, such as olive oil, canola oil, and tahini  - Limit nuts to no more than a handful a day  - Nuts include walnuts, almonds,  pecans, pistachios, pine nuts  - Limit or avoid candied, honey roasted or heavily salted nuts - Olives are central to the Mediterranean diet - can be eaten whole or used in a variety of dishes   Meats Protein - Limiting red meat: no more than a few times a month - When eating red meat: choose lean cuts and keep the portion to the size of deck of cards - Eggs: approx. 0 to 4 times a week  - Fish and lean poultry: at least 2 a week  - Healthy protein sources include, chicken, [place], lean beef, lamb - Increase intake of seafood such as tuna, salmon, trout, mackerel, shrimp, scallops - Avoid or limit high fat processed meats such as sausage and bacon  Dairy - Include moderate amounts of low fat dairy products  - Focus on healthy dairy such as fat free yogurt, skim milk, low or reduced fat cheese - Limit dairy products higher in fat such as whole or 2% milk, cheese, ice cream  Alcohol - Moderate amounts of red wine is ok  - No more than 5 oz daily for women (all ages) and men older than age 65  - No more than 10 oz of wine daily for men younger than 10  Other - Limit sweets and other desserts  - Use herbs and spices instead of salt to flavor foods  - Herbs and spices common to the traditional Mediterranean Diet include: basil, bay leaves, chives, cloves, cumin, fennel, garlic, lavender, marjoram, mint, oregano, parsley, pepper, rosemary, sage, savory, sumac, tarragon, thyme   It's not just a diet, it's a lifestyle:  . The Mediterranean diet includes lifestyle factors typical of those in the region  . Foods, drinks and meals are best eaten with others and savored . Daily physical activity is important for overall good health . This could be strenuous exercise like running and aerobics . This could also be more leisurely activities such as walking, housework, yard-work, or taking the stairs . Moderation is the key; a balanced and healthy diet accommodates most foods and drinks . Consider portion  sizes and frequency of consumption of certain foods   Meal Ideas & Options:  . Breakfast:  o Whole wheat toast or whole wheat English muffins with peanut butter & hard boiled egg o Steel cut oats topped with apples & cinnamon and skim milk  o Fresh fruit: banana, strawberries, melon, berries, peaches  o Smoothies: strawberries, bananas, greek yogurt, peanut butter o Low fat greek yogurt with blueberries and granola  o Egg white omelet with spinach and mushrooms o Breakfast couscous: whole wheat couscous, apricots, skim milk, cranberries  . Sandwiches:  o Hummus and grilled vegetables (peppers, zucchini, squash) on whole wheat bread   o Grilled chicken on whole wheat pita with lettuce, tomatoes, cucumbers or tzatziki  o Tuna salad on whole wheat bread: tuna salad made with greek yogurt, olives, red peppers, capers, green onions o Garlic rosemary lamb pita: lamb sauted with garlic, rosemary, salt & pepper; add lettuce, cucumber, greek yogurt to pita - flavor with lemon juice and black pepper  . Seafood:  o Mediterranean grilled salmon, seasoned with garlic, basil, parsley, lemon juice and black pepper o Shrimp, lemon, and spinach whole-grain pasta salad made with low fat greek yogurt  o Seared scallops with lemon orzo  o Seared tuna steaks seasoned salt, pepper, coriander topped with tomato mixture of olives, tomatoes, olive oil, minced garlic, parsley, green onions and cappers  . Meats:  o Herbed greek chicken salad with kalamata olives, cucumber, feta  o Red bell peppers stuffed with spinach, bulgur, lean ground beef (or lentils) & topped with feta   o Kebabs: skewers of chicken, tomatoes, onions, zucchini, squash  o [place] burgers: made with red onions, mint, dill, lemon juice, feta cheese topped with roasted red peppers . Vegetarian o Cucumber salad: cucumbers, artichoke hearts, celery, red onion, feta cheese, tossed in olive oil & lemon juice  o Hummus and whole grain pita points with  a greek salad (lettuce, tomato, feta, olives, cucumbers, red onion) o Lentil soup with celery, carrots made with vegetable broth, garlic, salt and pepper  o Tabouli salad: parsley, bulgur, mint, scallions, cucumbers, tomato, radishes, lemon juice, olive oil, salt and pepper.       Vertigo Vertigo is the feeling that you or the things around you are moving when they are not. This feeling can come and go at any time. Vertigo often goes away on its own. This condition can be dangerous if it happens when you are doing activities like driving or working with machines. Your doctor will do tests to find the cause of your vertigo. These tests will also help your doctor decide on the best treatment for you. Follow these instructions at home: Eating and drinking      Drink enough fluid to keep your pee (urine) pale yellow.  Do not drink alcohol. Activity  Return to your normal activities as told by your doctor. Ask your doctor what activities are safe for you.  In the morning, first sit up on the side of the bed. When you feel okay, stand slowly while you hold onto something until you know that your balance is fine.  Move slowly. Avoid sudden body or head movements or certain positions, as told by your doctor.  Use a cane if you have trouble standing or walking.  Sit down right away if you feel dizzy.  Avoid doing any tasks or activities that can cause danger to you or others if you get dizzy.  Avoid bending down if you feel dizzy. Place items in your home so that they are easy for you to reach without leaning over.  Do not drive or use heavy machinery if you feel dizzy. General instructions  Take over-the-counter and prescription medicines only as told by your doctor.  Keep all follow-up visits as told by your doctor. This is important. Contact a doctor if:  Your medicine does not help your vertigo.  You have a fever.  Your problems get worse or you have new symptoms.  Your  family or friends see changes in your behavior.  The feeling of being sick to your stomach gets worse.  Your vomiting gets worse.  You lose feeling (have numbness) in part of your body.  You feel prickling and tingling in a part of your body. Get help right away if:  You have trouble moving or talking.  You are always dizzy.  You pass out (faint).  You get very bad headaches.  You feel weak in your hands, arms, or legs.  You have changes in your hearing.  You have changes in how you see (vision).  You get a stiff neck.  Bright light starts to bother you. Summary  Vertigo is the feeling that you or the things around you are moving when they are not.  Your doctor will do tests to find the cause of your vertigo.  You may be told to avoid some  tasks, positions, or movements.  Contact a doctor if your medicine is not helping, or if you have a fever, new symptoms, or a change in behavior.  Get help right away if you get very bad headaches, or if you have changes in how you speak, hear, or see. This information is not intended to replace advice given to you by your health care provider. Make sure you discuss any questions you have with your health care provider. Document Revised: 05/03/2018 Document Reviewed: 05/03/2018 Elsevier Patient Education  2020 [employer].

## 2020-04-09 NOTE — Progress Notes (Signed)
Acute Office Visit  Subjective:    Patient ID: Keith Villegas, male    DOB: Dec 01, 1955, 64 y.o.   MRN: [id number]  Chief Complaint  Patient presents with  . Hypertension    HPI Patient is in today for elevated blood pressure noted while at urgent care center.  Patient went to the urgent care due to dizziness and  Mild visual changes and  was diagnosed with vertigo.He reports it is getting better. Visual changes have resolves. Feels better since starting meclizine. Is using debrox in [place] ears was told he had hard impacted wax at urgent care. He does not endorse any pain.   He reports chronic palpitations with anxiety and does not endorse any change he has " had these since he can remember" Does not endorse any new cardiac symptoms.    He was diagnosed with vertigo and cerumen impaction.  He had his ear irrigated and placed on Meclizine.  He states that is doing better.   Denies any dizziness with getting up or down, changing body positions.   Denies any falls.   He does not endorse any falls, pre syncopal or syncopal episodes.  He is urinating well.   Patient  denies any fever, body aches,chills, rash, chest pain, shortness of breath, nausea, vomiting, or diarrhea.    Past Medical History:  Diagnosis Date  . Costochondritis 09/12/2015  . GERD (gastroesophageal reflux disease)   . Hypertension     Past Surgical History:  Procedure Laterality Date  . APPENDECTOMY  1976  . COLONOSCOPY WITH PROPOFOL N/A 11/20/2016   Procedure: COLONOSCOPY WITH PROPOFOL;  Surgeon: Jonathon Bellows, MD;  Location: [facility] ENDOSCOPY;  Service: Endoscopy;  Laterality: N/A;  . echocardiogram  10/02/2010  . Myocardial perfusion scan  10/02/2010   normal perfusion. Normal systolic function. Borderline right ventricular dilation. EF>65%  . TOE SURGERY  2005    Family History  Problem Relation Age of Onset  . Cancer Mother        Dementia & Schizophrenia  . CAD Father     Social History    Socioeconomic History  . Marital status: Married    Spouse name: Not on file  . Number of children: 2  . Years of education: Not on file  . Highest education level: Not on file  Occupational History  . Occupation: Visual merchandiser    Comment: works at [employer]  . Smoking status: Former Research scientist (life sciences)  . Smokeless tobacco: Never Used  Vaping Use  . Vaping Use: Never used  Substance and Sexual Activity  . Alcohol use: No    Alcohol/week: 0.0 standard drinks  . Drug use: No  . Sexual activity: Not on file  Other Topics Concern  . Not on file  Social History Narrative  . Not on file   Social Determinants of Health   Financial Resource Strain:   . Difficulty of Paying Living Expenses: Not on file  Food Insecurity:   . Worried About Charity fundraiser in the Last Year: Not on file  . Ran Out of Food in the Last Year: Not on file  Transportation Needs:   . Lack of Transportation (Medical): Not on file  . Lack of Transportation (Non-Medical): Not on file  Physical Activity:   . Days of Exercise per Week: Not on file  . Minutes of Exercise per Session: Not on file  Stress:   . Feeling of Stress : Not on file  Social Connections:   .  Frequency of Communication with Friends and Family: Not on file  . Frequency of Social Gatherings with Friends and Family: Not on file  . Attends Religious Services: Not on file  . Active Member of Clubs or Organizations: Not on file  . Attends Archivist Meetings: Not on file  . Marital Status: Not on file  Intimate Partner Violence:   . Fear of Current or Ex-Partner: Not on file  . Emotionally Abused: Not on file  . Physically Abused: Not on file  . Sexually Abused: Not on file    Outpatient Medications Prior to Visit  Medication Sig Dispense Refill  . aspirin 81 MG tablet Take 81 mg by mouth daily.     . benazepril-hydrochlorthiazide (LOTENSIN HCT) 20-12.5 MG tablet TAKE ONE TABLET BY MOUTH ONCE DAILY 30  tablet 10  . carbamide peroxide (DEBROX) 6.5 % OTIC solution Administer 5 to 10 drops in each ear twice a day x4 to 5 days.    . hydrocortisone-pramoxine (ANALPRAM HC) 2.5-1 % rectal cream Place 1 application rectally 3 (three) times daily. 30 g 1  . meclizine (ANTIVERT) 25 MG tablet Take 25 mg by mouth 3 (three) times daily as needed for dizziness.    . metoprolol succinate (TOPROL-XL) 50 MG 24 hr tablet TAKE ONE TABLET BY MOUTH ONCE DAILY TAKE WITH OR IMMEDIATELY FOLLOWING A MEAL 30 tablet 12  . Multiple Vitamin (MULTIVITAMIN) capsule Take 1 capsule by mouth daily.    Marland Kitchen omeprazole (PRILOSEC) 40 MG capsule TAKE ONE CAPSULE BY MOUTH ONCE DAILY 90 capsule 3  . rosuvastatin (CRESTOR) 10 MG tablet TAKE ONE TABLET BY MOUTH ONCE DAILY 30 tablet 10  . meclizine (ANTIVERT) 25 MG tablet Take by mouth.    . gabapentin (NEURONTIN) 100 MG capsule Take 100 mg by mouth 3 (three) times daily.    . DEBROX 6.5 % OTIC solution SMARTSIG:5-10 Drop(s) In Ear(s) Twice Daily     No facility-administered medications prior to visit.    No Known Allergies  Review of Systems  Constitutional: Negative.   HENT: Negative.   Respiratory: Negative for apnea, cough, choking, chest tightness, shortness of breath, wheezing and stridor.   Cardiovascular: Positive for palpitations (chronic denies any change ). Negative for chest pain and leg swelling.  Gastrointestinal: Negative.   Genitourinary: Negative.   Musculoskeletal: Negative.   Skin: Negative.   Neurological: Positive for dizziness (mild) and headaches. Negative for tremors, seizures, syncope, facial asymmetry, speech difficulty, weakness, light-headedness and numbness.  Psychiatric/Behavioral: Negative.        Objective:    Physical Exam Constitutional:      General: He is not in acute distress.    Appearance: Normal appearance. He is obese. He is not ill-appearing, toxic-appearing or diaphoretic.     Comments: Patient appers well, not sickly. Speaking in  complete sentences. Patient moves on and off of exam table and in room without difficulty. Gait is normal in hall and in room. Patient is oriented to person place time and situation. Patient answers questions appropriately and engages eye contact and verbal dialect with provider.   HENT:     Head: Normocephalic and atraumatic.     Jaw: There is normal jaw occlusion.     Right Ear: External ear normal. There is impacted cerumen.     Left Ear: External ear normal. There is impacted cerumen.     Ears:     Comments: Dark bilateral hard cerumen bilaterally, unable to see tympanic membrane  or full ear  canal bilaterally,.  No ear drainage or tragal pull pain bilaterally.      Nose: No congestion or rhinorrhea.     Mouth/Throat:     Pharynx: Oropharynx is clear. No oropharyngeal exudate or posterior oropharyngeal erythema.  Eyes:     General: No scleral icterus.       Right eye: No discharge.        Left eye: No discharge.     Extraocular Movements: Extraocular movements intact.     Conjunctiva/sclera: Conjunctivae normal.  Neck:     Vascular: No carotid bruit.  Cardiovascular:     Rate and Rhythm: Normal rate and regular rhythm.     Pulses: Normal pulses.          Radial pulses are 2+ on the right side and 2+ on the left side.       Dorsalis pedis pulses are 2+ on the right side and 2+ on the left side.     Heart sounds: Normal heart sounds. No murmur heard.  No friction rub. No gallop.   Pulmonary:     Effort: Pulmonary effort is normal. No respiratory distress.     Breath sounds: No stridor. No wheezing, rhonchi or rales.  Chest:     Chest wall: No tenderness.  Abdominal:     General: There is no distension.     Palpations: Abdomen is soft.     Tenderness: There is no abdominal tenderness.  Musculoskeletal:        General: No swelling. Normal range of motion.     Cervical back: Normal range of motion and neck supple. No rigidity or tenderness.     Right lower leg: No edema.      Left lower leg: No edema.  Lymphadenopathy:     Cervical: No cervical adenopathy.  Skin:    General: Skin is warm.     Coloration: Skin is not jaundiced.  Neurological:     Mental Status: He is alert.     Cranial Nerves: Cranial nerves are intact.     Sensory: Sensation is intact.     Motor: Motor function is intact. No weakness.     Coordination: Coordination is intact.     Gait: Gait is intact. Gait normal.     Deep Tendon Reflexes: Reflexes are normal and symmetric.     Comments: Neurologic exam:  Speech clear, pupils equal round reactive to light, extraocular movements intact  Normal peripheral visual fields Cranial nerves III through XII normal including no facial droop Follows commands, moves all extremities x4, normal strength to bilateral upper and lower extremities at all major muscle groups including grip Sensation normal to light touch and pinprick Coordination intact, no limb ataxia, finger-nose-finger normal, heel shin normal bilaterally Rapid alternating movements normal Gait normal Can heal and toe walk without weakness.   Psychiatric:        Mood and Affect: Mood normal.        Behavior: Behavior normal.        Thought Content: Thought content normal.        Judgment: Judgment normal.     BP (!) 151/90 (BP Location: Right Arm, Patient Position: Sitting, Cuff Size: Normal)   Pulse 89   Temp 98.5 F (36.9 C) (Oral)   Wt 251 lb (113.9 kg)   SpO2 100%   BMI 37.07 kg/m  Wt Readings from Last 3 Encounters:  04/09/20 251 lb (113.9 kg)  01/20/20 (!) 260 lb 3.2 oz (118 kg)  01/07/19  257 lb (116.6 kg)     Health Maintenance Due  Topic Date Due  . INFLUENZA VACCINE  01/22/2020    There are no preventive care reminders to display for this patient.   Lab Results  Component Value Date   TSH 1.140 01/20/2020   Lab Results  Component Value Date   WBC 8.8 01/20/2020   HGB 15.6 01/20/2020   HCT 46.4 01/20/2020   MCV 85 01/20/2020   PLT 238 01/20/2020     Lab Results  Component Value Date   NA 140 01/20/2020   K 4.1 01/20/2020   CO2 25 01/20/2020   GLUCOSE 99 01/20/2020   BUN 14 01/20/2020   CREATININE 1.18 01/20/2020   BILITOT 0.5 01/20/2020   ALKPHOS 109 01/20/2020   AST 24 01/20/2020   ALT 23 01/20/2020   PROT 7.5 01/20/2020   ALBUMIN 4.9 (H) 01/20/2020   CALCIUM 9.9 01/20/2020   ANIONGAP 3 (L) 07/26/2012   Lab Results  Component Value Date   CHOL 197 01/20/2020   Lab Results  Component Value Date   HDL 44 01/20/2020   Lab Results  Component Value Date   LDLCALC 122 (H) 01/20/2020   Lab Results  Component Value Date   TRIG 176 (H) 01/20/2020   Lab Results  Component Value Date   CHOLHDL 4.5 01/20/2020   No results found for: HGBA1C     Assessment & Plan:   Problem List Items Addressed This Visit      Cardiovascular and Mediastinum   Hypertension - Primary   Relevant Orders   CBC with Differential/Platelet   TSH   Comprehensive Metabolic Panel (CMET)     Nervous and Auditory   Bilateral impacted cerumen     Other   Vertigo     No orders of the defined types were placed in this encounter.  If blood pressure is still elevated at next visit today is 151/90 will consider increase in Losartan- HCTZ dosage, he does not endorse any new cardiac symptosm.   Will check labs today.  Orders Placed This Encounter  Procedures  . CBC with Differential/Platelet  . TSH  . Comprehensive Metabolic Panel (CMET)   History of palpitations does not endorse worsening and is consistent and has been associated with anxiety in the past and he agrees.   Bilateral cerumen impaction, hard cerumen, continue using Debrox as directed and return to the office Thursday or Friday to have bilateral eat irrigation of cerumen if needed. Return sooner if any pain develops.    Red Flags discussed. The patient was given clear instructions to go to ER or return to medical center if any red flags develop, symptoms do not improve,  worsen or new problems develop. They verbalized understanding.  Addressed acute and or chronic medical problems today requiring 30 minutes reviewing patients medical record,labs, counseling patient regarding patient's conditions, any medications, answering questions regarding health, and coordination of care as needed. After visit summary patient given copy and reviewed.    Marcille Buffy, FNP

## 2020-04-10 ENCOUNTER — Other Ambulatory Visit: Payer: Self-pay | Admitting: Adult Health

## 2020-04-10 DIAGNOSIS — D72829 Elevated white blood cell count, unspecified: Secondary | ICD-10-CM | POA: Insufficient documentation

## 2020-04-10 LAB — CBC WITH DIFFERENTIAL/PLATELET
Basophils Absolute: 0.1 10*3/uL (ref 0.0–0.2)
Basos: 1 %
EOS (ABSOLUTE): 0.4 10*3/uL (ref 0.0–0.4)
Eos: 3 %
Hematocrit: 48.1 % (ref 37.5–51.0)
Hemoglobin: 16 g/dL (ref 13.0–17.7)
Immature Grans (Abs): 0 10*3/uL (ref 0.0–0.1)
Immature Granulocytes: 0 %
Lymphocytes Absolute: 2.1 10*3/uL (ref 0.7–3.1)
Lymphs: 17 %
MCH: 28.3 pg (ref 26.6–33.0)
MCHC: 33.3 g/dL (ref 31.5–35.7)
MCV: 85 fL (ref 79–97)
Monocytes Absolute: 0.7 10*3/uL (ref 0.1–0.9)
Monocytes: 6 %
Neutrophils Absolute: 9.1 10*3/uL — ABNORMAL HIGH (ref 1.4–7.0)
Neutrophils: 73 %
Platelets: 247 10*3/uL (ref 150–450)
RBC: 5.66 x10E6/uL (ref 4.14–5.80)
RDW: 13.3 % (ref 11.6–15.4)
WBC: 12.4 10*3/uL — ABNORMAL HIGH (ref 3.4–10.8)

## 2020-04-10 LAB — COMPREHENSIVE METABOLIC PANEL
ALT: 21 IU/L (ref 0–44)
AST: 18 IU/L (ref 0–40)
Albumin/Globulin Ratio: 1.6 (ref 1.2–2.2)
Albumin: 4.6 g/dL (ref 3.8–4.8)
Alkaline Phosphatase: 110 IU/L (ref 44–121)
BUN/Creatinine Ratio: 12 (ref 10–24)
BUN: 14 mg/dL (ref 8–27)
Bilirubin Total: 0.4 mg/dL (ref 0.0–1.2)
CO2: 30 mmol/L — ABNORMAL HIGH (ref 20–29)
Calcium: 10.3 mg/dL — ABNORMAL HIGH (ref 8.6–10.2)
Chloride: 101 mmol/L (ref 96–106)
Creatinine, Ser: 1.13 mg/dL (ref 0.76–1.27)
GFR calc Af Amer: 80 mL/min/{1.73_m2} (ref >59)
GFR calc non Af Amer: 69 mL/min/{1.73_m2} (ref >59)
Globulin, Total: 2.9 g/dL (ref 1.5–4.5)
Glucose: 122 mg/dL — ABNORMAL HIGH (ref 65–99)
Potassium: 4.4 mmol/L (ref 3.5–5.2)
Sodium: 141 mmol/L (ref 134–144)
Total Protein: 7.5 g/dL (ref 6.0–8.5)

## 2020-04-10 LAB — TSH: TSH: 1.57 u[IU]/mL (ref 0.450–4.500)

## 2020-04-10 MED ORDER — AMOXICILLIN-POT CLAVULANATE 875-125 MG PO TABS: 1.0000 | ORAL_TABLET | Freq: Two times a day (BID) | ORAL | 0 refills | Status: DC

## 2020-04-10 NOTE — Progress Notes (Signed)
Please add on hemoglobin A1C for elevated glucose.  He does have elevation in WBC and increased neutrophils, will treat possible bacterial infection since unable to see his ears on exam due to cerumen.Have sent antibiotic Augmentin to take BID x 10 days. Take with food.   Report any new onset and or any  worsening symptoms.   Please add repeat CBC in 2 weeks to labs and advise patient of need to repeat lab.   Advised patient call the office or your primary care doctor for an appointment if no improvement within 72 hours or if any symptoms change or worsen at any time  Advised ER or urgent Care if after hours or on weekend. Call 911 for emergency symptoms at any time.Patinet verbalized understanding of all instructions given/reviewed and treatment plan and has no further questions or concerns at this time.

## 2020-04-11 ENCOUNTER — Telehealth: Payer: Self-pay

## 2020-04-11 DIAGNOSIS — D72829 Elevated white blood cell count, unspecified: Secondary | ICD-10-CM

## 2020-04-11 NOTE — Telephone Encounter (Signed)
Patient was advised and understood, future order has been placed in chart and HGba1C has been added. KW

## 2020-04-11 NOTE — Telephone Encounter (Signed)
-----   Message from Doreen Beam, [place] sent at 04/10/2020 12:18 PM EDT ----- Please add on hemoglobin A1C for elevated glucose.  He does have elevation in WBC and increased neutrophils, will treat possible bacterial infection since unable to see his ears on exam due to cerumen.Have sent antibiotic Augmentin to take BID x 10 days. Take with food.   Report any new onset and or any  worsening symptoms.   Please add repeat CBC in 2 weeks to labs and advise patient of need to repeat lab.   Advised patient call the office or your primary care doctor for an appointment if no improvement within 72 hours or if any symptoms change or worsen at any time  Advised ER or urgent Care if after hours or on weekend. Call 911 for emergency symptoms at any time.Patinet verbalized understanding of all instructions given/reviewed and treatment plan and has no further questions or concerns at this time.

## 2020-04-11 NOTE — Telephone Encounter (Signed)
Copied from [place] [id number]. Topic: General - Inquiry >> Apr 11, 2020  1:25 PM Alease Frame wrote: Reason for CRM:Pt called in states he had labs down and hasnt received the results back . He is wanting a call back when labs come in . Please advise

## 2020-04-11 NOTE — Telephone Encounter (Signed)
See telephone encounter patient has been advised 2:56PM.KW

## 2020-04-12 ENCOUNTER — Other Ambulatory Visit: Payer: Self-pay

## 2020-04-12 ENCOUNTER — Encounter: Payer: Self-pay | Admitting: Adult Health

## 2020-04-12 ENCOUNTER — Ambulatory Visit: Payer: BC Managed Care – PPO | Admitting: Adult Health

## 2020-04-12 VITALS — BP 159/80 | HR 91 | Temp 98.3°F | Resp 16 | Wt 251.4 lb

## 2020-04-12 DIAGNOSIS — H60503 Unspecified acute noninfective otitis externa, bilateral: Secondary | ICD-10-CM | POA: Diagnosis not present

## 2020-04-12 DIAGNOSIS — H6123 Impacted cerumen, bilateral: Secondary | ICD-10-CM

## 2020-04-12 DIAGNOSIS — H6501 Acute serous otitis media, right ear: Secondary | ICD-10-CM | POA: Diagnosis not present

## 2020-04-12 DIAGNOSIS — D72829 Elevated white blood cell count, unspecified: Secondary | ICD-10-CM | POA: Diagnosis not present

## 2020-04-12 LAB — SPECIMEN STATUS REPORT

## 2020-04-12 MED ORDER — ACETIC ACID 2 % OT SOLN: 4.0000 [drp] | Freq: Two times a day (BID) | OTIC | 0 refills | Status: DC

## 2020-04-12 MED ORDER — NEOMYCIN-POLYMYXIN-HC 1 % OT SOLN: 3.0000 [drp] | Freq: Three times a day (TID) | OTIC | 0 refills | Status: DC

## 2020-04-12 NOTE — Progress Notes (Signed)
Established patient visit   Patient: Keith Villegas   DOB: [birth year]   64 y.o. Male  MRN: [id number] Visit Date: 04/12/2020  Today's healthcare provider: Marcille Buffy, FNP   Chief Complaint  Patient presents with  . Ear Fullness   Subjective    Ear Fullness  There is pain in both ears. This is a new problem. The current episode started in the past 7 days (feeling some better since starting AUgmentin and using debrox. ). The problem has been gradually improving. There has been no fever. The pain is at a severity of 0/10. The patient is experiencing no pain. Pertinent negatives include no abdominal pain, coughing, diarrhea, ear discharge, headaches, hearing loss, neck pain, rash, rhinorrhea, sore throat or vomiting. He has tried ear drops for the symptoms. The treatment provided no relief. His past medical history is significant for a chronic ear infection.    Patient  denies any fever, body aches,chills, rash, chest pain, shortness of breath, nausea, vomiting, or diarrhea.      Medications: Outpatient Medications Prior to Visit  Medication Sig  . amoxicillin-clavulanate (AUGMENTIN) 875-125 MG tablet Take 1 tablet by mouth 2 (two) times daily.  Marland Kitchen aspirin 81 MG tablet Take 81 mg by mouth daily.   . benazepril-hydrochlorthiazide (LOTENSIN HCT) 20-12.5 MG tablet TAKE ONE TABLET BY MOUTH ONCE DAILY  . carbamide peroxide (DEBROX) 6.5 % OTIC solution Administer 5 to 10 drops in each ear twice a day x4 to 5 days.  Marland Kitchen gabapentin (NEURONTIN) 100 MG capsule Take 100 mg by mouth 3 (three) times daily.  . hydrocortisone-pramoxine (ANALPRAM HC) 2.5-1 % rectal cream Place 1 application rectally 3 (three) times daily.  . meclizine (ANTIVERT) 25 MG tablet Take 25 mg by mouth 3 (three) times daily as needed for dizziness.  . metoprolol succinate (TOPROL-XL) 50 MG 24 hr tablet TAKE ONE TABLET BY MOUTH ONCE DAILY TAKE WITH OR IMMEDIATELY FOLLOWING A MEAL  . Multiple Vitamin (MULTIVITAMIN)  capsule Take 1 capsule by mouth daily.  Marland Kitchen omeprazole (PRILOSEC) 40 MG capsule TAKE ONE CAPSULE BY MOUTH ONCE DAILY  . rosuvastatin (CRESTOR) 10 MG tablet TAKE ONE TABLET BY MOUTH ONCE DAILY   No facility-administered medications prior to visit.    Review of Systems  Constitutional: Negative.   HENT: Positive for ear pain. Negative for congestion, ear discharge, hearing loss, postnasal drip, rhinorrhea and sore throat.   Respiratory: Negative.  Negative for cough.   Cardiovascular: Negative.   Gastrointestinal: Negative for abdominal pain, diarrhea and vomiting.  Genitourinary: Negative.   Musculoskeletal: Negative.  Negative for neck pain.  Skin: Negative for rash.  Neurological: Negative for dizziness and headaches.  Psychiatric/Behavioral: Negative.       Objective    BP (!) 159/80   Pulse 91   Temp 98.3 F (36.8 C) (Oral)   Resp 16   Wt 251 lb 6.4 oz (114 kg)   SpO2 98%   BMI 37.13 kg/m    Physical Exam Vitals reviewed.  Constitutional:      Appearance: Normal appearance. He is obese. He is not ill-appearing, toxic-appearing or diaphoretic.  HENT:     Head: Normocephalic.     Jaw: There is normal jaw occlusion.     Salivary Glands: Right salivary gland is not diffusely enlarged or tender. Left salivary gland is not diffusely enlarged or tender.     Right Ear: External ear normal. Swelling (bilateral ear canals with mild erythema noted after bilateral canals were irrigated  by CMA Nunzio Cory, patient tolerated without any difficulty. ) present. A middle ear effusion is present. There is no impacted cerumen. Tympanic membrane is not erythematous.     Left Ear: External ear normal. Swelling present. A middle ear effusion is present. There is no impacted cerumen. Tympanic membrane is not erythematous.     Nose: Nose normal.  Cardiovascular:     Rate and Rhythm: Normal rate.     Pulses: Normal pulses.     Heart sounds: Normal heart sounds. No murmur heard.  No friction  rub. No gallop.   Pulmonary:     Effort: Pulmonary effort is normal. No respiratory distress.     Breath sounds: Normal breath sounds. No stridor. No wheezing, rhonchi or rales.  Chest:     Chest wall: No tenderness.  Abdominal:     Palpations: Abdomen is soft.  Lymphadenopathy:     Cervical: No cervical adenopathy.     Right cervical: No superficial, deep or posterior cervical adenopathy.    Left cervical: No superficial, deep or posterior cervical adenopathy.  Neurological:     Mental Status: He is alert.      No results found for any visits on 04/12/20.  Assessment & Plan    Acute otitis externa of both ears, unspecified type - Plan: CBC With Differential  Leukocytosis, unspecified type - Plan: CBC With Differential  Non-recurrent acute serous otitis media of right ear  Cerumen debris on tympanic membrane of both ears - Plan: Ear Lavage  Finish Augmentin, previous otitis externa is resolving. Patients ears feeling better he reports.  Ear canals very irritate and does have history of itching.   Meds ordered this encounter  Medications  . acetic acid 2 % otic solution    Sig: Place 4 drops into both ears in the morning and at bedtime.    Dispense:  15 mL    Refill:  0  . NEOMYCIN-POLYMYXIN-HYDROCORTISONE (CORTISPORIN) 1 % SOLN OTIC solution    Sig: Place 3 drops into both ears every 8 (eight) hours.    Dispense:  10 mL    Refill:  0    An After Visit Summary was printed and given to the patient.  Return if symptoms worsen or fail to improve, for at any time for any worsening symptoms, Go to Emergency room/ urgent care if worse.     Red Flags discussed. The patient was given clear instructions to go to ER or return to medical center if any red flags develop, symptoms do not improve, worsen or new problems develop. They verbalized understanding. Advised patient call the office or your primary care doctor for an appointment if no improvement within 72 hours or if any  symptoms change or worsen at any time  Advised ER or urgent Care if after hours or on weekend. Call 911 for emergency symptoms at any time.Patinet verbalized understanding of all instructions given/reviewed and treatment plan and has no further questions or concerns at this time.    Addressed acute and or chronic medical problems today requiring 30 minutes reviewing patients medical record,labs, counseling patient regarding patient's conditions, any medications, answering questions regarding health, and coordination of care as needed. After visit summary patient given copy and reviewed.   Marcille Buffy, [place] [phone] (phone) [phone] (fax)  [place]

## 2020-04-12 NOTE — Patient Instructions (Addendum)
Orders Placed This Encounter  Procedures  . CBC With Differential    Standing Status:   Future    Standing Expiration Date:   08/13/2020  come to lab walk in around one month for repeat CBC, will send or call with results.   Advised patient call the office or your primary care doctor for an appointment if no improvement within 72 hours or if any symptoms change or worsen at any time  Advised ER or urgent Care if after hours or on weekend. Call 911 for emergency symptoms at any time.Patinet verbalized understanding of all instructions given/reviewed and treatment plan and has no further questions or concerns at this time.        DASH Eating Plan DASH stands for "Dietary Approaches to Stop Hypertension." The DASH eating plan is a healthy eating plan that has been shown to reduce high blood pressure (hypertension). It may also reduce your risk for type 2 diabetes, heart disease, and stroke. The DASH eating plan may also help with weight loss. What are tips for following this plan?  General guidelines  Avoid eating more than 2,300 mg (milligrams) of salt (sodium) a day. If you have hypertension, you may need to reduce your sodium intake to 1,500 mg a day.  Limit alcohol intake to no more than 1 drink a day for nonpregnant women and 2 drinks a day for men. One drink equals 12 oz of beer, 5 oz of wine, or 1 oz of hard liquor.  Work with your health care provider to maintain a healthy body weight or to lose weight. Ask what an ideal weight is for you.  Get at least 30 minutes of exercise that causes your heart to beat faster (aerobic exercise) most days of the week. Activities may include walking, swimming, or biking.  Work with your health care provider or diet and nutrition specialist (dietitian) to adjust your eating plan to your individual calorie needs. Reading food labels   Check food labels for the amount of sodium per serving. Choose foods with less than 5 percent of the Daily Value  of sodium. Generally, foods with less than 300 mg of sodium per serving fit into this eating plan.  To find whole grains, look for the word "whole" as the first word in the ingredient list. Shopping  Buy products labeled as "low-sodium" or "no salt added."  Buy fresh foods. Avoid canned foods and premade or frozen meals. Cooking  Avoid adding salt when cooking. Use salt-free seasonings or herbs instead of table salt or sea salt. Check with your health care provider or pharmacist before using salt substitutes.  Do not fry foods. Cook foods using healthy methods such as baking, boiling, grilling, and broiling instead.  Cook with heart-healthy oils, such as olive, canola, soybean, or sunflower oil. Meal planning  Eat a balanced diet that includes: ? 5 or more servings of fruits and vegetables each day. At each meal, try to fill half of your plate with fruits and vegetables. ? Up to 6-8 servings of whole grains each day. ? Less than 6 oz of lean meat, poultry, or fish each day. A 3-oz serving of meat is about the same size as a deck of cards. One egg equals 1 oz. ? 2 servings of low-fat dairy each day. ? A serving of nuts, seeds, or beans 5 times each week. ? Heart-healthy fats. Healthy fats called Omega-3 fatty acids are found in foods such as flaxseeds and coldwater fish, like sardines, salmon,  and mackerel.  Limit how much you eat of the following: ? Canned or prepackaged foods. ? Food that is high in trans fat, such as fried foods. ? Food that is high in saturated fat, such as fatty meat. ? Sweets, desserts, sugary drinks, and other foods with added sugar. ? Full-fat dairy products.  Do not salt foods before eating.  Try to eat at least 2 vegetarian meals each week.  Eat more home-cooked food and less restaurant, buffet, and fast food.  When eating at a restaurant, ask that your food be prepared with less salt or no salt, if possible. What foods are recommended? The items  listed may not be a complete list. Talk with your dietitian about what dietary choices are best for you. Grains Whole-grain or whole-wheat bread. Whole-grain or whole-wheat pasta. Brown rice. [place]row. Bulgur. Whole-grain and low-sodium cereals. Pita bread. Low-fat, low-sodium crackers. Whole-wheat flour tortillas. Vegetables Fresh or frozen vegetables (raw, steamed, roasted, or grilled). Low-sodium or reduced-sodium tomato and vegetable juice. Low-sodium or reduced-sodium tomato sauce and tomato paste. Low-sodium or reduced-sodium canned vegetables. Fruits All fresh, dried, or frozen fruit. Canned fruit in natural juice (without added sugar). Meat and other protein foods Skinless chicken or [place]. Ground chicken or [place]. Pork with fat trimmed off. Fish and seafood. Egg whites. Dried beans, peas, or lentils. Unsalted nuts, nut butters, and seeds. Unsalted canned beans. Lean cuts of beef with fat trimmed off. Low-sodium, lean deli meat. Dairy Low-fat (1%) or fat-free (skim) milk. Fat-free, low-fat, or reduced-fat cheeses. Nonfat, low-sodium ricotta or cottage cheese. Low-fat or nonfat yogurt. Low-fat, low-sodium cheese. Fats and oils Soft margarine without trans fats. Vegetable oil. Low-fat, reduced-fat, or light mayonnaise and salad dressings (reduced-sodium). Canola, safflower, olive, soybean, and sunflower oils. Avocado. Seasoning and other foods Herbs. Spices. Seasoning mixes without salt. Unsalted popcorn and pretzels. Fat-free sweets. What foods are not recommended? The items listed may not be a complete list. Talk with your dietitian about what dietary choices are best for you. Grains Baked goods made with fat, such as croissants, muffins, or some breads. Dry pasta or rice meal packs. Vegetables Creamed or fried vegetables. Vegetables in a cheese sauce. Regular canned vegetables (not low-sodium or reduced-sodium). Regular canned tomato sauce and paste (not low-sodium or  reduced-sodium). Regular tomato and vegetable juice (not low-sodium or reduced-sodium). Angie Fava. Olives. Fruits Canned fruit in a light or heavy syrup. Fried fruit. Fruit in cream or butter sauce. Meat and other protein foods Fatty cuts of meat. Ribs. Fried meat. Berniece Salines. Sausage. Bologna and other processed lunch meats. Salami. Fatback. Hotdogs. Bratwurst. Salted nuts and seeds. Canned beans with added salt. Canned or smoked fish. Whole eggs or egg yolks. Chicken or [place] with skin. Dairy Whole or 2% milk, cream, and half-and-half. Whole or full-fat cream cheese. Whole-fat or sweetened yogurt. Full-fat cheese. Nondairy creamers. Whipped toppings. Processed cheese and cheese spreads. Fats and oils Butter. Stick margarine. Lard. Shortening. Ghee. Bacon fat. Tropical oils, such as coconut, palm kernel, or palm oil. Seasoning and other foods Salted popcorn and pretzels. Onion salt, garlic salt, seasoned salt, table salt, and sea salt. Worcestershire sauce. Tartar sauce. Barbecue sauce. Teriyaki sauce. Soy sauce, including reduced-sodium. Steak sauce. Canned and packaged gravies. Fish sauce. Oyster sauce. Cocktail sauce. Horseradish that you find on the shelf. Ketchup. Mustard. Meat flavorings and tenderizers. Bouillon cubes. Hot sauce and Tabasco sauce. Premade or packaged marinades. Premade or packaged taco seasonings. Relishes. Regular salad dressings. Where to find more information:  National Heart,  Lung, and Blood Institute: https://wilson-eaton.com/  American Heart Association: www.heart.org Summary  The DASH eating plan is a healthy eating plan that has been shown to reduce high blood pressure (hypertension). It may also reduce your risk for type 2 diabetes, heart disease, and stroke.  With the DASH eating plan, you should limit salt (sodium) intake to 2,300 mg a day. If you have hypertension, you may need to reduce your sodium intake to 1,500 mg a day.  When on the DASH eating plan, aim to eat more  fresh fruits and vegetables, whole grains, lean proteins, low-fat dairy, and heart-healthy fats.  Work with your health care provider or diet and nutrition specialist (dietitian) to adjust your eating plan to your individual calorie needs. This information is not intended to replace advice given to you by your health care provider. Make sure you discuss any questions you have with your health care provider. Document Revised: 05/22/2017 Document Reviewed: 06/02/2016 Elsevier Patient Education  [place].  Blood Pressure Record Sheet To take your blood pressure, you will need a blood pressure machine. You can buy a blood pressure machine (blood pressure monitor) at your clinic, drug store, or online. When choosing one, consider:  An automatic monitor that has an arm cuff.  A cuff that wraps snugly around your upper arm. You should be able to fit only one finger between your arm and the cuff.  A device that stores blood pressure reading results.  Do not choose a monitor that measures your blood pressure from your wrist or finger. Follow your health care provider's instructions for how to take your blood pressure. To use this form:  Get one reading in the morning (a.m.) before you take any medicines.  Get one reading in the evening (p.m.) before supper.  Take at least 2 readings with each blood pressure check. This makes sure the results are correct. Wait 1-2 minutes between measurements.  Write down the results in the spaces on this form.  Repeat this once a week, or as told by your health care provider.  Make a follow-up appointment with your health care provider to discuss the results. Blood pressure log Date: _______________________  a.m. _____________________(1st reading) _____________________(2nd reading)  p.m. _____________________(1st reading) _____________________(2nd reading) Date: _______________________  a.m. _____________________(1st reading)  _____________________(2nd reading)  p.m. _____________________(1st reading) _____________________(2nd reading) Date: _______________________  a.m. _____________________(1st reading) _____________________(2nd reading)  p.m. _____________________(1st reading) _____________________(2nd reading) Date: _______________________  a.m. _____________________(1st reading) _____________________(2nd reading)  p.m. _____________________(1st reading) _____________________(2nd reading) Date: _______________________  a.m. _____________________(1st reading) _____________________(2nd reading)  p.m. _____________________(1st reading) _____________________(2nd reading) This information is not intended to replace advice given to you by your health care provider. Make sure you discuss any questions you have with your health care provider. Document Revised: 08/07/2017 Document Reviewed: 06/09/2017 Elsevier Patient Education  2020 [place] patient call the office or your primary care doctor for an appointment if no improvement within 72 hours or if any symptoms change or worsen at any time  Advised ER or urgent Care if after hours or on weekend. Call 911 for emergency symptoms at any time.Patinet verbalized understanding of all instructions given/reviewed and treatment plan and has no further questions or concerns at this time.      Otitis Media, Adult  Otitis media means that the middle ear is red and swollen (inflamed) and full of fluid. The condition usually goes away on its own. Follow these instructions at home:  Take over-the-counter and prescription medicines only as told  by your doctor.  If you were prescribed an antibiotic medicine, take it as told by your doctor. Do not stop taking the antibiotic even if you start to feel better.  Keep all follow-up visits as told by your doctor. This is important. Contact a doctor if:  You have bleeding from your nose.  There is a lump on  your neck.  You are not getting better in 5 days.  You feel worse instead of better. Get help right away if:  You have pain that is not helped with medicine.  You have swelling, redness, or pain around your ear.  You get a stiff neck.  You cannot move part of your face (paralyzed).  You notice that the bone behind your ear hurts when you touch it.  You get a very bad headache. Summary  Otitis media means that the middle ear is red, swollen, and full of fluid.  This condition usually goes away on its own. In some cases, treatment may be needed.  If you were prescribed an antibiotic medicine, take it as told by your doctor. This information is not intended to replace advice given to you by your health care provider. Make sure you discuss any questions you have with your health care provider. Document Revised: 05/22/2017 Document Reviewed: 06/30/2016 Elsevier Patient Education  2020 [place]. Otitis Externa  Otitis externa is an infection of the outer ear canal. The outer ear canal is the area between the outside of the ear and the eardrum. Otitis externa is sometimes called swimmer's ear. What are the causes? Common causes of this condition include:  Swimming in dirty water.  Moisture in the ear.  An injury to the inside of the ear.  An object stuck in the ear.  A cut or scrape on the outside of the ear. What increases the risk? You are more likely to get this condition if you go swimming often. What are the signs or symptoms?  Itching in the ear. This is often the first symptom.  Swelling of the ear.  Redness in the ear.  Ear pain. The pain may get worse when you pull on your ear.  Pus coming from the ear. How is this treated? This condition may be treated with:  Antibiotic ear drops. These are often given for 10-14 days.  Medicines to reduce itching and swelling. Follow these instructions at home:  If you were given antibiotic ear drops, use them  as told by your doctor. Do not stop using them even if your condition gets better.  Take over-the-counter and prescription medicines only as told by your doctor.  Avoid getting water in your ears as told by your doctor. You may be told to avoid swimming or water sports for a few days.  Keep all follow-up visits as told by your doctor. This is important. How is this prevented?  Keep your ears dry. Use the corner of a towel to dry your ears after you swim or bathe.  Try not to scratch or put things in your ear. Doing these things makes it easier for germs to grow in your ear.  Avoid swimming in lakes, dirty water, or pools that may not have the right amount of a chemical called chlorine. Contact a doctor if:  You have a fever.  Your ear is still red, swollen, or painful after 3 days.  You still have pus coming from your ear after 3 days.  Your redness, swelling, or pain gets worse.  You  have a really bad headache.  You have redness, swelling, pain, or tenderness behind your ear. Summary  Otitis externa is an infection of the outer ear canal.  Symptoms include pain, redness, and swelling of the ear.  If you were given antibiotic ear drops, use them as told by your doctor. Do not stop using them even if your condition gets better.  Try not to scratch or put things in your ear. This information is not intended to replace advice given to you by your health care provider. Make sure you discuss any questions you have with your health care provider. Document Revised: 11/13/2017 Document Reviewed: 11/13/2017 Elsevier Patient Education  [place]. Acetic Acid ear solution What is this medicine? ACETIC ACID (a SEE tik AS id) ear drops are used to treat external ear infections, such as "swimmer's ear". This medicine may be used for other purposes; ask your health care provider or pharmacist if you have questions. COMMON BRAND NAME(S): Acetasol, Borofair, VoSoL What should I  tell my health care provider before I take this medicine? They need to know if you have any of these conditions:  ruptured ear drum  an unusual or allergic reaction to acetic acid, propylene glycol, other medicines, foods, dyes, or preservatives  pregnant or trying to get pregnant  breast-feeding How should I use this medicine? This medicine is only for use in your ears. Follow the directions on the prescription label. Wash your hands with soap and water. First, carefully clean your ear(s) with a dry cotton swab. Gently warm the bottle by holding it in the hand for 1 to 2 minutes. Use as directed by your doctor or health care professional. Shanda Howells down on your side with the affected ear up. Try not to touch the tip of the dropper to your ear, fingertips, or other surface. Squeeze the bottle gently to place the prescribed number of drops in the ear canal. Stay in this position for 30 to 60 seconds, and up to 5 minutes, to help the drops soak into the ear. Repeat, if necessary, for the opposite ear. Do not use your medicine more often than directed. Finish the full course of medicine prescribed by your health care professional even if you think your condition is better. Talk to your pediatrician regarding the use of this medicine in children. While this drug may be prescribed for children as young as 73 years of age and older for selected conditions, precautions do apply. Overdosage: If you think you have taken too much of this medicine contact a poison control center or emergency room at once. NOTE: This medicine is only for you. Do not share this medicine with others. What if I miss a dose? If you miss a dose, use it as soon as you can. If it is almost time for your next dose, use only that dose. Do not use double or extra doses. What may interact with this medicine? Interactions are not expected. Do not use other ear products without talking to your doctor or health care professional. This list may  not describe all possible interactions. Give your health care provider a list of all the medicines, herbs, non-prescription drugs, or dietary supplements you use. Also tell them if you smoke, drink alcohol, or use illegal drugs. Some items may interact with your medicine. What should I watch for while using this medicine? Tell your doctor or health care professional if your ear infection does not get better in a few days. If a rash  or allergic reaction occurs, stop using this product right away and contact your doctor or health care professional. It is important that you keep the infected ear(s) clean and dry. When bathing, try not to get the infected ear(s) wet. Do not go swimming unless your doctor or health care professional has told you otherwise. To prevent the spread of infection, do not share ear products or share towels and washcloths with anyone else. What side effects may I notice from receiving this medicine? Side effects that you should report to your doctor or health care professional as soon as possible:  allergic reactions like skin rash, itching or hives, swelling of the face, lips, or tongue  burning and redness  worsening ear pain Side effects that usually do not require medical attention (report to your doctor or health care professional if they continue or are bothersome):  unpleasant feeling while putting the drops in the ear This list may not describe all possible side effects. Call your doctor for medical advice about side effects. You may report side effects to FDA at 1-800-FDA-1088. Where should I keep my medicine? Keep out of the reach of children. Store at room temperature between 15 and 30 degrees C (59 and 86 degrees F). Throw away any unused medicine after the expiration date. NOTE: This sheet is a summary. It may not cover all possible information. If you have questions about this medicine, talk to your doctor, pharmacist, or health care provider.  2020  Elsevier/Gold Standard (2015-07-12 08:33:14) Hydrocortisone; Neomycin; Polymyxin B ear solution What is this medicine? HYDROCORTISONE; NEOMYCIN; and POLYMYXIN B (hye droe KOR ti sone; nee oh MYE sin; pol i MIX in B) is used to treat ear infections. This medicine may be used for other purposes; ask your health care provider or pharmacist if you have questions. COMMON BRAND NAME(S): AK-Spore HC, AK-Spore HC Otic, Antibiotic Otic, Cortisporin, Cortomycin, Oti-Sone, Oticin HC, Otimar, Otocidin What should I tell my health care provider before I take this medicine? They need to know if you have any of these conditions:  any other active infections  chronic ear infections or fluid in the ear  perforated ear drum  an unusual or allergic reaction to hydrocortisone, neomycin, polymyxin B, sulfites, other medicines, foods, dyes, or preservatives  pregnant or trying to get pregnant  breast-feeding How should I use this medicine? This medicine is only for use in the ears. Follow the directions on the prescription label. Wash hands before and after use. Clean your ear of any fluid that can be easily removed. Do not insert any object or swab into the ear canal. Gently warm the bottle by holding it in the hand for 1 to 2 minutes. Lie down on your side with the infected ear facing upward. Try not to touch the tip of the dropper to your ear, fingertips, or other surface. Squeeze the bottle gently to put the prescribed number of drops in the ear canal. Stay in this position for 30 to 60 seconds to help the drops soak into the ear. Repeat the steps for the other ear if both ears are infected. Do not use your medicine more often than directed. Finish the full course of medicine prescribed by your doctor or health care professional even if you think your condition is better. Talk to your pediatrician regarding the use of this medicine in children. While this drug may be prescribed for selected conditions,  precautions do apply. Overdosage: If you think you have taken too much of  this medicine contact a poison control center or emergency room at once. NOTE: This medicine is only for you. Do not share this medicine with others. What if I miss a dose? If you miss a dose, use it as soon as you can. If it is almost time for your next dose, use only that dose. Do not take double or extra doses. What may interact with this medicine? Interactions are not expected. Do not use other ear products without talking to your doctor or health care professional. This list may not describe all possible interactions. Give your health care provider a list of all the medicines, herbs, non-prescription drugs, or dietary supplements you use. Also tell them if you smoke, drink alcohol, or use illegal drugs. Some items may interact with your medicine. What should I watch for while using this medicine? Tell your doctor or health care professional if your ear infection does not get better in a few days. Do not use longer than 10 days unless instructed by your doctor or health care professional. If rash or allergic reaction occurs, stop the product immediately and contact your physician. It is important that you keep the infected ear(s) clean and dry. When bathing, try not to get the infected ear(s) wet. Do not go swimming unless your doctor or health care professional has told you otherwise. To prevent the spread of infection, do not share ear products, or share towels and washcloths with anyone else. What side effects may I notice from receiving this medicine? Side effects that you should report to your doctor or health care professional as soon as possible:  rash  red, itchy, dry scaly skin at the affected site  worsening ear pain Side effects that usually do not require medical attention (report to your doctor or health care professional if they continue or are bothersome):  abnormal sensation in the ear  burning or  stinging while putting the drops in the ear This list may not describe all possible side effects. Call your doctor for medical advice about side effects. You may report side effects to FDA at 1-800-FDA-1088. Where should I keep my medicine? Keep out of the reach of children. Store at room temperature between 15 and 25 degrees C (59 and 77 degrees F). Do not freeze. Throw away any unused medicine after the expiration date. NOTE: This sheet is a summary. It may not cover all possible information. If you have questions about this medicine, talk to your doctor, pharmacist, or health care provider.  2020 Elsevier/Gold Standard (2014-11-28 15:13:48) Amoxicillin; Clavulanic Acid Tablets What is this medicine? AMOXICILLIN; CLAVULANIC ACID (a mox i SIL in; KLAV yoo lan ic AS id) is a penicillin antibiotic. It treats some infections caused by bacteria. It will not work for colds, the flu, or other viruses. This medicine may be used for other purposes; ask your health care provider or pharmacist if you have questions. COMMON BRAND NAME(S): Augmentin What should I tell my health care provider before I take this medicine? They need to know if you have any of these conditions:  bowel disease, like colitis  kidney disease  liver disease  mononucleosis  an unusual or allergic reaction to amoxicillin, penicillin, cephalosporin, other antibiotics, clavulanic acid, other medicines, foods, dyes, or preservatives  pregnant or trying to get pregnant  breast-feeding How should I use this medicine? Take this drug by mouth. Take it as directed on the prescription label at the same time every day. Take it with food at the start  of a meal or snack. Take all of this drug unless your health care provider tells you to stop it early. Keep taking it even if you think you are better. Talk to your health care provider about the use of this drug in children. While it may be prescribed for selected conditions,  precautions do apply. Overdosage: If you think you have taken too much of this medicine contact a poison control center or emergency room at once. NOTE: This medicine is only for you. Do not share this medicine with others. What if I miss a dose? If you miss a dose, take it as soon as you can. If it is almost time for your next dose, take only that dose. Do not take double or extra doses. What may interact with this medicine?  allopurinol  anticoagulants  birth control pills  methotrexate  probenecid This list may not describe all possible interactions. Give your health care provider a list of all the medicines, herbs, non-prescription drugs, or dietary supplements you use. Also tell them if you smoke, drink alcohol, or use illegal drugs. Some items may interact with your medicine. What should I watch for while using this medicine? Tell your doctor or healthcare provider if your symptoms do not improve. This medicine may cause serious skin reactions. They can happen weeks to months after starting the medicine. Contact your healthcare provider right away if you notice fevers or flu-like symptoms with a rash. The rash may be red or purple and then turn into blisters or peeling of the skin. Or, you might notice a red rash with swelling of the face, lips or lymph nodes in your neck or under your arms. Do not treat diarrhea with over the counter products. Contact your doctor if you have diarrhea that lasts more than 2 days or if it is severe and watery. If you have diabetes, you may get a false-positive result for sugar in your urine. Check with your doctor or healthcare provider. Birth control pills may not work properly while you are taking this medicine. Talk to your doctor about using an extra method of birth control. What side effects may I notice from receiving this medicine? Side effects that you should report to your doctor or health care professional as soon as possible:  allergic  reactions like skin rash, itching or hives, swelling of the face, lips, or tongue  breathing problems  dark urine  fever or chills, sore throat  redness, blistering, peeling, or loosening of the skin, including inside the mouth  seizures  trouble passing urine or change in the amount of urine  unusual bleeding, bruising  unusually weak or tired  white patches or sores in the mouth or throat Side effects that usually do not require medical attention (report to your doctor or health care professional if they continue or are bothersome):  diarrhea  dizziness  headache  nausea, vomiting  stomach upset  vaginal or anal irritation This list may not describe all possible side effects. Call your doctor for medical advice about side effects. You may report side effects to FDA at 1-800-FDA-1088. Where should I keep my medicine? Keep out of the reach of children and pets. Store at room temperature between 20 and 25 degrees C (68 and 77 degrees F). Throw away any unused drug after the expiration date. NOTE: This sheet is a summary. It may not cover all possible information. If you have questions about this medicine, talk to your doctor, pharmacist, or health care  provider.  2020 Elsevier/Gold Standard (2019-01-10 11:55:53)

## 2020-04-13 LAB — HEMOGLOBIN A1C
Est. average glucose Bld gHb Est-mCnc: 128 mg/dL
Hgb A1c MFr Bld: 6.1 % — ABNORMAL HIGH (ref 4.8–5.6)

## 2020-04-13 NOTE — Progress Notes (Signed)
Prediabetic, education on diet and lifestyle changes. Increase exercise.  Follow up with pcp as directed.

## 2020-04-16 ENCOUNTER — Telehealth: Payer: Self-pay | Admitting: Family Medicine

## 2020-04-16 ENCOUNTER — Telehealth: Payer: Self-pay

## 2020-04-16 NOTE — Telephone Encounter (Signed)
Patient advised and verbalized understanding 

## 2020-04-16 NOTE — Telephone Encounter (Signed)
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  #3O tablets no refills  Return to office seek care/ strict return precautions.

## 2020-04-16 NOTE — Telephone Encounter (Signed)
He should definitely get a booster, but it doesn't have to be J&J. The [place] boosters work just a well even if his first does was the J&J vaccine, so he should get whichever booster he can find.

## 2020-04-16 NOTE — Telephone Encounter (Signed)
Copied from [place] [id number]. Topic: General - Inquiry >> Apr 16, 2020 11:12 AM Lennox Solders wrote: Reason for CRM: Pt is calling and would like to ask the provider if he should get JJ booster

## 2020-04-16 NOTE — Telephone Encounter (Signed)
Please review patient request. Keith Villegas

## 2020-04-16 NOTE — Telephone Encounter (Signed)
Pt saw michelle on 04/12/2020 for dizziness  and per pt michelle told him she would send in meclizine if he need medication . Pt would like medication sent to  [facility]

## 2020-04-17 MED ORDER — MECLIZINE HCL 25 MG PO TABS: 25.0000 mg | ORAL_TABLET | Freq: Three times a day (TID) | ORAL | 0 refills | Status: DC | PRN

## 2020-04-20 DIAGNOSIS — R002 Palpitations: Secondary | ICD-10-CM | POA: Insufficient documentation

## 2020-04-25 ENCOUNTER — Other Ambulatory Visit: Payer: Self-pay

## 2020-04-25 ENCOUNTER — Other Ambulatory Visit: Payer: Self-pay | Admitting: Adult Health

## 2020-04-25 MED ORDER — GABAPENTIN 100 MG PO CAPS
100.0000 mg | ORAL_CAPSULE | Freq: Three times a day (TID) | ORAL | 1 refills | Status: DC
Start: 2020-04-25 — End: 2020-04-25

## 2020-04-25 MED ORDER — GABAPENTIN 100 MG PO CAPS
100.0000 mg | ORAL_CAPSULE | Freq: Three times a day (TID) | ORAL | 1 refills | Status: DC
Start: 2020-04-25 — End: 2024-04-27

## 2020-04-25 NOTE — Progress Notes (Signed)
First script printed. Shredded.  Meds ordered this encounter  Medications  . gabapentin (NEURONTIN) 100 MG capsule    Sig: Take 1 capsule (100 mg total) by mouth 3 (three) times daily.    Dispense:  90 capsule    Refill:  1

## 2020-04-25 NOTE — Telephone Encounter (Signed)
Dr. Caryn Section is out of the office until 04/30/2020. Please review.

## 2020-04-25 NOTE — Telephone Encounter (Signed)
[facility] faxed refill request for the following medications:  gabapentin (NEURONTIN) 100 MG capsule  LOV: 04/12/2020 Please advise. Thanks TNP

## 2020-04-30 ENCOUNTER — Telehealth: Payer: Self-pay

## 2020-04-30 NOTE — Telephone Encounter (Signed)
Copied from [place] [id number]. Topic: General - Inquiry >> Apr 30, 2020 10:10 AM Keith Villegas D wrote: Reason for CRM: Pt called saying he needs FMLA papers filled out.   He was at [facility] last weekend for high blood pressure.  He is on blood pressure medication and has a FU appt  Monday the 15th with Dr. Caryn Section.  He said wants to take his new medication for a while to make sure it is working before he returns to work.  He wants to know if he brings the FMLA papers to the office will Dr. Caryn Section complete them.    CB#  [phone].

## 2020-05-02 ENCOUNTER — Telehealth: Payer: Self-pay

## 2020-05-02 NOTE — Telephone Encounter (Signed)
FMLA papers being sent back for completion.   He said has an appt. Here on Monday 05/07/20 and will pick them up at that time.

## 2020-05-02 NOTE — Telephone Encounter (Signed)
Please advise since he was seen for new symptoms in the ER and not what we saw him for, he will need to address with PCP at visit on Monday and may take up to 72 hours to receive them back. He has appointment with PCP [facility] on Monday.

## 2020-05-02 NOTE — Telephone Encounter (Signed)
FYI

## 2020-05-02 NOTE — Telephone Encounter (Signed)
Patient was advised will see Dr. Caryn Section Monday. KW

## 2020-05-04 NOTE — Progress Notes (Signed)
Established patient visit   Patient: Keith Villegas   DOB: 12/13/1955   64 y.o. Male  MRN: [id number] Visit Date: 05/07/2020  Today's healthcare provider: Lelon Huh, MD   Chief Complaint  Patient presents with  . Hypertension  . Hospitalization Follow-up   Subjective    HPI  Follow up Hospitalization  Patient was admitted to [facility] on 04/20/2020 and discharged on 04/21/2020. He was treated for palpitations, lightheadedness, hypertension and dizziness.Treatment for this included overnight admission for observation and blood pressure control. Echo on HD 1 notable for normal LVEF, heart size in the upper limit of normal. Suspect his symptoms were due to his elevated BP. Patient was increased on his lisinopril/hydrochlorothiazide combination pill. He was also continued on his metoprolol. Symptoms were improved while hospitalized, but never resolved. Patient was advised to follow up with PCP in 1-2 weeks to ensure blood pressure control. Per discharge summary, patient reported symptoms consistent with OSA; consider sleep study referral as he does feel sleepy during the day.He reports good compliance with treatment. Home blood pressure readings have averaged between 135-145/ 80-85. Patient still has occasional palpitations.   He was initially seen at Urgent care in early October with dizziness and difficulty with balance, associated with blurry vision in both eyes. He states he has episodes of feeling off balance and occasional near falls. Does not feel a spinning sensation and does not feel light headed with standing. No nausea or vomiting.    Results of the Epworth flowsheet 05/07/2020  Sitting and reading 3  Watching TV 3  Sitting, inactive in a public place (e.g. a theatre or a meeting) 3  As a passenger in a car for an hour without a break 1  Lying down to rest in the afternoon when circumstances permit 3  Sitting and talking to someone 3  Sitting quietly after a lunch  without alcohol 3  In a car, while stopped for a few minutes in traffic 1  Total score 20        Medications: Outpatient Medications Prior to Visit  Medication Sig  . aspirin 81 MG tablet Take 81 mg by mouth daily.   Marland Kitchen gabapentin (NEURONTIN) 100 MG capsule Take 1 capsule (100 mg total) by mouth 3 (three) times daily.  . hydrocortisone-pramoxine (ANALPRAM HC) 2.5-1 % rectal cream Place 1 application rectally 3 (three) times daily.  . meclizine (ANTIVERT) 25 MG tablet Take 1 tablet (25 mg total) by mouth every 8 (eight) hours as needed (vertigo).  . metoprolol succinate (TOPROL-XL) 50 MG 24 hr tablet TAKE ONE TABLET BY MOUTH ONCE DAILY TAKE WITH OR IMMEDIATELY FOLLOWING A MEAL  . Multiple Vitamin (MULTIVITAMIN) capsule Take 1 capsule by mouth daily.  Marland Kitchen omeprazole (PRILOSEC) 40 MG capsule TAKE ONE CAPSULE BY MOUTH ONCE DAILY  . rosuvastatin (CRESTOR) 10 MG tablet TAKE ONE TABLET BY MOUTH ONCE DAILY  . lisinopril-hydrochlorothiazide (ZESTORETIC) 20-25 MG tablet Take 1 tablet by mouth daily.  . [DISCONTINUED] acetic acid 2 % otic solution Place 4 drops into both ears in the morning and at bedtime. (Patient not taking: Reported on 05/07/2020)  . [DISCONTINUED] amoxicillin-clavulanate (AUGMENTIN) 875-125 MG tablet Take 1 tablet by mouth 2 (two) times daily. (Patient not taking: Reported on 05/07/2020)  . [DISCONTINUED] benazepril-hydrochlorthiazide (LOTENSIN HCT) 20-12.5 MG tablet TAKE ONE TABLET BY MOUTH ONCE DAILY (Patient not taking: Reported on 05/07/2020)  . [DISCONTINUED] carbamide peroxide (DEBROX) 6.5 % OTIC solution Administer 5 to 10 drops in each ear twice  a day x4 to 5 days. (Patient not taking: Reported on 05/07/2020)  . [DISCONTINUED] NEOMYCIN-POLYMYXIN-HYDROCORTISONE (CORTISPORIN) 1 % SOLN OTIC solution Place 3 drops into both ears every 8 (eight) hours. (Patient not taking: Reported on 05/07/2020)   No facility-administered medications prior to visit.    Review of Systems    Constitutional: Negative for appetite change, chills and fever.  Respiratory: Negative for chest tightness, shortness of breath and wheezing.   Cardiovascular: Positive for palpitations. Negative for chest pain.  Gastrointestinal: Negative for abdominal pain, nausea and vomiting.  Neurological: Positive for dizziness.      Objective    BP 136/86 (BP Location: Left Arm, Patient Position: Sitting, Cuff Size: Large)   Pulse 67   Temp 98.4 F (36.9 C) (Oral)   Resp 16   Wt 245 lb (111.1 kg)   BMI 36.18 kg/m    Physical Exam    General: Appearance:    Obese male in no acute distress  Eyes:    PERRL, conjunctiva/corneas clear, EOM's intact       Lungs:     Clear to auscultation bilaterally, respirations unlabored  Heart:    Normal heart rate. Normal rhythm. No murmurs, rubs, or gallops.   MS:   All extremities are intact.   Neurologic:   Awake, alert, oriented x 3. +Romberg. Difficulty with finger to nose cerebellar functions. Unable to perform tandem gait.          Assessment & Plan     1. Ataxia Persistent for 6 weeks, no significant relief with meclizine and not typical for BPV. Consider associated with visual disturbances need to rule out posterior CNS disease.  - MR Brain Wo Contrast; Future  2. Blurry vision, bilateral  - MR Brain Wo Contrast; Future  3. Palpitations Mostly resolved.   4. Primary hypertension Well controlled.  refill- lisinopril-hydrochlorothiazide (ZESTORETIC) 20-25 MG tablet; Take 1 tablet by mouth daily.  5. Hypersomnia  - Home sleep test   No follow-ups on file.      The entirety of the information documented in the History of Present Illness, Review of Systems and Physical Exam were personally obtained by me. Portions of this information were initially documented by the CMA and reviewed by me for thoroughness and accuracy.      Lelon Huh, MD  [facility] [phone] (phone) [phone] (fax)  [place]

## 2020-05-07 ENCOUNTER — Other Ambulatory Visit: Payer: Self-pay

## 2020-05-07 ENCOUNTER — Encounter: Payer: Self-pay | Admitting: Family Medicine

## 2020-05-07 ENCOUNTER — Ambulatory Visit: Payer: BC Managed Care – PPO | Admitting: Family Medicine

## 2020-05-07 VITALS — BP 136/86 | HR 67 | Temp 98.4°F | Resp 16 | Wt 245.0 lb

## 2020-05-07 DIAGNOSIS — G4710 Hypersomnia, unspecified: Secondary | ICD-10-CM

## 2020-05-07 DIAGNOSIS — I10 Essential (primary) hypertension: Secondary | ICD-10-CM | POA: Diagnosis not present

## 2020-05-07 DIAGNOSIS — H538 Other visual disturbances: Secondary | ICD-10-CM

## 2020-05-07 DIAGNOSIS — R270 Ataxia, unspecified: Secondary | ICD-10-CM | POA: Diagnosis not present

## 2020-05-07 DIAGNOSIS — R002 Palpitations: Secondary | ICD-10-CM

## 2020-05-07 DIAGNOSIS — R42 Dizziness and giddiness: Secondary | ICD-10-CM

## 2020-05-10 ENCOUNTER — Telehealth: Payer: Self-pay | Admitting: Family Medicine

## 2020-05-10 NOTE — Telephone Encounter (Signed)
Pt's insurance company would like to speak peer to peer. Phone [phone] # [id number]

## 2020-05-11 NOTE — Telephone Encounter (Signed)
I was not able to get through to them on Friday, but I did finish the office note. Do they just need to see the note, all the information is in there... or do they prefer a different imaging study.

## 2020-05-14 NOTE — Telephone Encounter (Signed)
They need to speak peer to peer

## 2020-05-14 NOTE — Telephone Encounter (Signed)
Please review. Thanks!  

## 2020-05-15 ENCOUNTER — Telehealth: Payer: Self-pay | Admitting: Family Medicine

## 2020-05-15 DIAGNOSIS — H538 Other visual disturbances: Secondary | ICD-10-CM

## 2020-05-15 DIAGNOSIS — R270 Ataxia, unspecified: Secondary | ICD-10-CM

## 2020-05-15 NOTE — Telephone Encounter (Signed)
Please check with patient to see if he is still having any dizziness or blurry vision. His insurance company won't approve the MRI, so if he is still having any symptoms we will need to set up a referral to a neurologist.

## 2020-05-15 NOTE — Telephone Encounter (Signed)
I called and spoke with patient he states he is still occasionally having some dizziness and blurred vision. He would like to proceed with Neurology referral    Patient wanted to also mention that since starting Lisinopril/ HCTZ he has been more constipated. He says he normally has a bowel movement three times a day, but since starting that medication he has a bowel movement every other day. He wants to know if this is a side effect of the medication? And what could be done to help improve constipation?

## 2020-06-04 ENCOUNTER — Telehealth: Payer: Self-pay

## 2020-06-04 DIAGNOSIS — N529 Male erectile dysfunction, unspecified: Secondary | ICD-10-CM

## 2020-06-04 NOTE — Telephone Encounter (Signed)
Copied from [place] [phone]. Topic: Referral - Request for Referral >> Jun 04, 2020  2:25 PM Erick Blinks wrote: Has patient seen PCP for this complaint? Yes.   *If NO, is insurance requiring patient see PCP for this issue before PCP can refer them? Referral for which specialty: Urology Preferred provider/office: Highest recommended, close in location Reason for referral: Pt wants "his groove back" (confidential he says, don't mention)- possible erectile dysfunction.   Best contact: [phone]

## 2020-06-12 ENCOUNTER — Other Ambulatory Visit: Payer: Self-pay

## 2020-06-12 ENCOUNTER — Encounter: Payer: Self-pay | Admitting: Urology

## 2020-06-12 ENCOUNTER — Ambulatory Visit: Payer: BC Managed Care – PPO | Admitting: Urology

## 2020-06-12 VITALS — BP 153/82 | HR 69 | Ht 71.0 in | Wt 245.0 lb

## 2020-06-12 DIAGNOSIS — N529 Male erectile dysfunction, unspecified: Secondary | ICD-10-CM | POA: Diagnosis not present

## 2020-06-12 MED ORDER — TADALAFIL 5 MG PO TABS
5.0000 mg | ORAL_TABLET | Freq: Every day | ORAL | 6 refills | Status: DC
Start: 2020-06-12 — End: 2020-08-06

## 2020-06-12 NOTE — Progress Notes (Signed)
   06/12/20 2:43 PM   [name] 1955-08-14 [id number]  CC: ED  HPI: I saw Mr. Spiers for evaluation of erectile dysfunction.  He is with his wife today who also contributes most of the history.  He is a 64 year old male with hypertension who reports at least a few years of erectile dysfunction.  He does still get some erections overnight.  He was previously seen at a men's clinic in [place] and was on testosterone injections as well as penile injections.  He did not do well with needles and never followed through with this.  He reports he has never tried PDE 5 inhibitors previously.  He denies any nitrates for chest pain.  There are no prior testosterone values to evaluate.  He denies any family history of prostate cancer.  Creatinine 1.13, hematocrit 48, PSA 0.2.    PMH: Past Medical History:  Diagnosis Date  . Costochondritis 09/12/2015  . GERD (gastroesophageal reflux disease)   . Hypertension     Surgical History: Past Surgical History:  Procedure Laterality Date  . APPENDECTOMY  1976  . COLONOSCOPY WITH PROPOFOL N/A 11/20/2016   Procedure: COLONOSCOPY WITH PROPOFOL;  Surgeon: Jonathon Bellows, MD;  Location: [facility] ENDOSCOPY;  Service: Endoscopy;  Laterality: N/A;  . echocardiogram  10/02/2010  . Myocardial perfusion scan  10/02/2010   normal perfusion. Normal systolic function. Borderline right ventricular dilation. EF>65%  . TOE SURGERY  2005    Family History: Family History  Problem Relation Age of Onset  . Cancer Mother        Dementia & Schizophrenia  . CAD Father     Social History:  reports that he has quit smoking. He has never used smokeless tobacco. He reports that he does not drink alcohol and does not use drugs.  Physical Exam: BP (!) 153/82   Pulse 69   Ht 5\' 11"  (1.803 m)   Wt 245 lb (111.1 kg)   BMI 34.17 kg/m    Constitutional:  Alert and oriented, No acute distress. Cardiovascular: No clubbing, cyanosis, or edema. Respiratory: Normal  respiratory effort, no increased work of breathing. GI: Abdomen is soft, nontender, nondistended, no abdominal masses GU: Uncircumcised phallus with patent meatus, testicles 20 cc and descended bilaterally without masses or lesions  Laboratory Data: Creatinine 1.13, hematocrit 48, PSA 0.2.   Pertinent Imaging: None to review  Assessment & Plan:   He is a 64 year old male with a few years of erectile dysfunction.  He has never tried PDE 5 inhibitors, but was previously seen at a men's clinic and started on testosterone injections and penile injections, but he never perform these as he and his wife do not do well with needles.  We reviewed treatment options for erectile dysfunction including behavioral strategies of weight loss, healthy eating, and exercise, PDE 5 inhibitors and the risks and benefits, penile injections, and consideration of checking a testosterone in the future.  He would like to start with a trial of Cialis.  -Cialis 5 to 10 mg daily, consider transitioning to as needed if effective -RTC 1 month with PA for symptom check, consider increasing dose, trial of sildenafil, and/or checking testosterone at that visit if no significant improvement  Nickolas Madrid, MD 06/12/2020  [facility] [address] [place], [place] [address] [phone]

## 2020-06-12 NOTE — Patient Instructions (Signed)
Erectile Dysfunction Erectile dysfunction (ED) is the inability to get or keep an erection in order to have sexual intercourse. Erectile dysfunction may include:  Inability to get an erection.  Lack of enough hardness of the erection to allow penetration.  Loss of the erection before sex is finished. What are the causes? This condition may be caused by:  Certain medicines, such as: ? Pain relievers. ? Antihistamines. ? Antidepressants. ? Blood pressure medicines. ? Water pills (diuretics). ? Ulcer medicines. ? Muscle relaxants. ? Drugs.  Excessive drinking.  Psychological causes, such as: ? Anxiety. ? Depression. ? Sadness. ? Exhaustion. ? Performance fear. ? Stress.  Physical causes, such as: ? Artery problems. This may include diabetes, smoking, liver disease, or atherosclerosis. ? High blood pressure. ? Hormonal problems, such as low testosterone. ? Obesity. ? Nerve problems. This may include back or pelvic injuries, diabetes mellitus, multiple sclerosis, or Parkinson disease. What are the signs or symptoms? Symptoms of this condition include:  Inability to get an erection.  Lack of enough hardness of the erection to allow penetration.  Loss of the erection before sex is finished.  Normal erections at some times, but with frequent unsatisfactory episodes.  Low sexual satisfaction in either partner due to erection problems.  A curved penis occurring with erection. The curve may cause pain or the penis may be too curved to allow for intercourse.  Never having nighttime erections. How is this diagnosed? This condition is often diagnosed by:  Performing a physical exam to find other diseases or specific problems with the penis.  Asking you detailed questions about the problem.  Performing blood tests to check for diabetes mellitus or to measure hormone levels.  Performing other tests to check for underlying health conditions.  Performing an ultrasound  exam to check for scarring.  Performing a test to check blood flow to the penis.  Doing a sleep study at home to measure nighttime erections. How is this treated? This condition may be treated by:  Medicine taken by mouth to help you achieve an erection (oral medicine).  Hormone replacement therapy to replace low testosterone levels.  Medicine that is injected into the penis. Your health care provider may instruct you how to give yourself these injections at home.  Vacuum pump. This is a pump with a ring on it. The pump and ring are placed on the penis and used to create pressure that helps the penis become erect.  Penile implant surgery. In this procedure, you may receive: ? An inflatable implant. This consists of cylinders, a pump, and a reservoir. The cylinders can be inflated with a fluid that helps to create an erection, and they can be deflated after intercourse. ? A semi-rigid implant. This consists of two silicone rubber rods. The rods provide some rigidity. They are also flexible, so the penis can both curve downward in its normal position and become straight for sexual intercourse.  Blood vessel surgery, to improve blood flow to the penis. During this procedure, a blood vessel from a different part of the body is placed into the penis to allow blood to flow around (bypass) damaged or blocked blood vessels.  Lifestyle changes, such as exercising more, losing weight, and quitting smoking. Follow these instructions at home: Medicines   Take over-the-counter and prescription medicines only as told by your health care provider. Do not increase the dosage without first discussing it with your health care provider.  If you are using self-injections, perform injections as directed by your   health care provider. Make sure to avoid any veins that are on the surface of the penis. After giving an injection, apply pressure to the injection site for 5 minutes. General  instructions  Exercise regularly, as directed by your health care provider. Work with your health care provider to lose weight, if needed.  Do not use any products that contain nicotine or tobacco, such as cigarettes and e-cigarettes. If you need help quitting, ask your health care provider.  Before using a vacuum pump, read the instructions that come with the pump and discuss any questions with your health care provider.  Keep all follow-up visits as told by your health care provider. This is important. Contact a health care provider if:  You feel nauseous.  You vomit. Get help right away if:  You are taking oral or injectable medicines and you have an erection that lasts longer than 4 hours. If your health care provider is unavailable, go to the nearest emergency room for evaluation. An erection that lasts much longer than 4 hours can result in permanent damage to your penis.  You have severe pain in your groin or abdomen.  You develop redness or severe swelling of your penis.  You have redness spreading up into your groin or lower abdomen.  You are unable to urinate.  You experience chest pain or a rapid heart beat (palpitations) after taking oral medicines. Summary  Erectile dysfunction (ED) is the inability to get or keep an erection during sexual intercourse. This problem can usually be treated successfully.  This condition is diagnosed based on a physical exam, your symptoms, and tests to determine the cause. Treatment varies depending on the cause, and may include medicines, hormone therapy, surgery, or vacuum pump.  You may need follow-up visits to make sure that you are using your medicines or devices correctly.  Get help right away if you are taking or injecting medicines and you have an erection that lasts longer than 4 hours. This information is not intended to replace advice given to you by your health care provider. Make sure you discuss any questions you have with  your health care provider. Document Revised: 05/22/2017 Document Reviewed: 06/25/2016 Elsevier Patient Education  [place].  Tadalafil tablets (Cialis) What is this medicine? TADALAFIL (tah DA la fil) is used to treat erection problems in men. It is also used for enlargement of the prostate gland in men, a condition called benign prostatic hyperplasia or BPH. This medicine improves urine flow and reduces BPH symptoms. This medicine can also treat both erection problems and BPH when they occur together. This medicine may be used for other purposes; ask your health care provider or pharmacist if you have questions. COMMON BRAND NAME(S): Kathaleen Bury, Cialis What should I tell my health care provider before I take this medicine? They need to know if you have any of these conditions:  bleeding disorders  eye or vision problems, including a rare inherited eye disease called retinitis pigmentosa  anatomical deformation of the penis, Peyronie's disease, or history of priapism (painful and prolonged erection)  heart disease, angina, a history of heart attack, irregular heart beats, or other heart problems  high or low blood pressure  history of blood diseases, like sickle cell anemia or leukemia  history of stomach bleeding  kidney disease  liver disease  stroke  an unusual or allergic reaction to tadalafil, other medicines, foods, dyes, or preservatives  pregnant or trying to get pregnant  breast-feeding How should I  use this medicine? Take this medicine by mouth with a [place] of water. Follow the directions on the prescription label. You may take this medicine with or without meals. When this medicine is used for erection problems, your doctor may prescribe it to be taken once daily or as needed. If you are taking the medicine as needed, you may be able to have sexual activity 30 minutes after taking it and for up to 36 hours after taking it. Whether you are taking the  medicine as needed or once daily, you should not take more than one dose per day. If you are taking this medicine for symptoms of benign prostatic hyperplasia (BPH) or to treat both BPH and an erection problem, take the dose once daily at about the same time each day. Do not take your medicine more often than directed. Talk to your pediatrician regarding the use of this medicine in children. Special care may be needed. Overdosage: If you think you have taken too much of this medicine contact a poison control center or emergency room at once. NOTE: This medicine is only for you. Do not share this medicine with others. What if I miss a dose? If you are taking this medicine as needed for erection problems, this does not apply. If you miss a dose while taking this medicine once daily for an erection problem, benign prostatic hyperplasia, or both, take it as soon as you remember, but do not take more than one dose per day. What may interact with this medicine? Do not take this medicine with any of the following medications:  nitrates like amyl nitrite, isosorbide dinitrate, isosorbide mononitrate, nitroglycerin  other medicines for erectile dysfunction like avanafil, sildenafil, vardenafil  other tadalafil products (Adcirca)  riociguat This medicine may also interact with the following medications:  certain drugs for high blood pressure  certain drugs for the treatment of HIV infection or AIDS  certain drugs used for fungal or yeast infections, like fluconazole, itraconazole, ketoconazole, and voriconazole  certain drugs used for seizures like carbamazepine, phenytoin, and phenobarbital  grapefruit juice  macrolide antibiotics like clarithromycin, erythromycin, troleandomycin  medicines for prostate problems  rifabutin, rifampin or rifapentine This list may not describe all possible interactions. Give your health care provider a list of all the medicines, herbs, non-prescription drugs, or  dietary supplements you use. Also tell them if you smoke, drink alcohol, or use illegal drugs. Some items may interact with your medicine. What should I watch for while using this medicine? If you notice any changes in your vision while taking this drug, call your doctor or health care professional as soon as possible. Stop using this medicine and call your health care provider right away if you have a loss of sight in one or both eyes. Contact your doctor or health care professional right away if the erection lasts longer than 4 hours or if it becomes painful. This may be a sign of serious problem and must be treated right away to prevent permanent damage. If you experience symptoms of nausea, dizziness, chest pain or arm pain upon initiation of sexual activity after taking this medicine, you should refrain from further activity and call your doctor or health care professional as soon as possible. Do not drink alcohol to excess (examples, 5 glasses of wine or 5 shots of whiskey) when taking this medicine. When taken in excess, alcohol can increase your chances of getting a headache or getting dizzy, increasing your heart rate or lowering your blood pressure.  effects may I notice from receiving this medicine? Side effects that you should report to your doctor or health care professional as soon as possible: allergic reactions like skin rash, itching or hives, swelling of the face, lips, or tongue breathing problems changes in hearing changes in vision chest pain fast, irregular heartbeat prolonged or painful erection seizures Side effects that usually do not require medical attention (report to your doctor or health care professional if they continue or are bothersome): back pain dizziness flushing headache indigestion muscle aches nausea stuffy or runny nose This list may not describe  all possible side effects. Call your doctor for medical advice about side effects. You may report side effects to FDA at 1-800-FDA-1088. Where should I keep my medicine? Keep out of the reach of children. Store at room temperature between 15 and 30 degrees C (59 and 86 degrees F). Throw away any unused medicine after the expiration date. NOTE: This sheet is a summary. It may not cover all possible information. If you have questions about this medicine, talk to your doctor, pharmacist, or health care provider.  2020 Elsevier/Gold Standard (2013-10-28 13:15:49)  

## 2020-06-13 ENCOUNTER — Telehealth: Payer: Self-pay | Admitting: Family Medicine

## 2020-06-13 DIAGNOSIS — I10 Essential (primary) hypertension: Secondary | ICD-10-CM

## 2020-06-13 MED ORDER — LISINOPRIL-HYDROCHLOROTHIAZIDE 20-25 MG PO TABS: 1.0000 | ORAL_TABLET | Freq: Every day | ORAL | 2 refills | Status: DC

## 2020-06-13 NOTE — Telephone Encounter (Signed)
[facility] faxed refill request for the following medications:  lisinopril-hydrochlorothiazide (ZESTORETIC) 20-25 MG tablet   Please advise. Thanks, [employer]

## 2020-07-16 ENCOUNTER — Other Ambulatory Visit: Payer: Self-pay

## 2020-07-16 ENCOUNTER — Ambulatory Visit (INDEPENDENT_AMBULATORY_CARE_PROVIDER_SITE_OTHER): Payer: Self-pay | Admitting: Urology

## 2020-07-16 ENCOUNTER — Other Ambulatory Visit
Admission: RE | Admit: 2020-07-16 | Discharge: 2020-07-16 | Disposition: A | Discharge status: Home / Self Care | Payer: BC Managed Care – PPO | Attending: Urology | Admitting: Urology

## 2020-07-16 ENCOUNTER — Encounter: Payer: Self-pay | Admitting: Urology

## 2020-07-16 VITALS — BP 145/83 | HR 62 | Ht 71.0 in | Wt 250.0 lb

## 2020-07-16 DIAGNOSIS — N529 Male erectile dysfunction, unspecified: Secondary | ICD-10-CM

## 2020-07-16 LAB — URINALYSIS, COMPLETE (UACMP) WITH MICROSCOPIC
Bacteria, UA: NONE SEEN
Bilirubin Urine: NEGATIVE
Glucose, UA: NEGATIVE mg/dL
Hgb urine dipstick: NEGATIVE
Ketones, ur: NEGATIVE mg/dL
Leukocytes,Ua: NEGATIVE
Nitrite: NEGATIVE
Protein, ur: NEGATIVE mg/dL
RBC / HPF: NONE SEEN RBC/hpf (ref 0–5)
Specific Gravity, Urine: 1.02 (ref 1.005–1.030)
Squamous Epithelial / HPF: NONE SEEN (ref 0–5)
WBC, UA: NONE SEEN WBC/hpf (ref 0–5)
pH: 6.5 (ref 5.0–8.0)

## 2020-07-16 NOTE — Progress Notes (Signed)
07/16/2020 3:15 PM   Keith Villegas 06-01-1956 [id number]  Referring provider: Birdie Sons, MD [address] [place],  [place] [address]  Chief Complaint  Patient presents with  . Erectile Dysfunction   Urological history 1. ED - SHIM 15 - contributing factors of age, HTN and DM - managed with tadalafil 5 mg daily  HPI: Keith Villegas is a 65 y.o. male who presents today for follow up with his wife, Keith Villegas, on phone.    He has found the daily Cialis 5 mg very effective.  Patient still having spontaneous erections.   He denies any pain or curvature with erections.    SHIM    Row Name 07/16/20 1441         SHIM: Over the last 6 months:   How do you rate your confidence that you could get and keep an erection? Moderate     When you had erections with sexual stimulation, how often were your erections hard enough for penetration (entering your partner)? Sometimes (about half the time)     During sexual intercourse, how often were you able to maintain your erection after you had penetrated (entered) your partner? Sometimes (about half the time)     During sexual intercourse, how difficult was it to maintain your erection to completion of intercourse? Very Difficult     When you attempted sexual intercourse, how often was it satisfactory for you? Most Times (much more than half the time)           SHIM Total Score   SHIM 15            Score: 1-7 Severe ED 8-11 Moderate ED 12-16 Mild-Moderate ED 17-21 Mild ED 22-25 No ED  PMH: Past Medical History:  Diagnosis Date  . Costochondritis 09/12/2015  . GERD (gastroesophageal reflux disease)   . Hypertension     Surgical History: Past Surgical History:  Procedure Laterality Date  . APPENDECTOMY  1976  . COLONOSCOPY WITH PROPOFOL N/A 11/20/2016   Procedure: COLONOSCOPY WITH PROPOFOL;  Surgeon: Jonathon Bellows, MD;  Location[facility] ENDOSCOPY;  Service: Endoscopy;  Laterality: N/A;  . echocardiogram   10/02/2010  . Myocardial perfusion scan  10/02/2010   normal perfusion. Normal systolic function. Borderline right ventricular dilation. EF>65%  . TOE SURGERY  2005    Home Medications:  Allergies as of 07/16/2020   No Known Allergies     Medication List       Accurate as of July 16, 2020  3:15 PM. If you have any questions, ask your nurse or doctor.        aspirin 81 MG tablet Take 81 mg by mouth daily.   gabapentin 100 MG capsule Commonly known as: NEURONTIN Take 1 capsule (100 mg total) by mouth 3 (three) times daily.   hydrocortisone-pramoxine 2.5-1 % rectal cream Commonly known as: Analpram HC Place 1 application rectally 3 (three) times daily.   lisinopril-hydrochlorothiazide 20-25 MG tablet Commonly known as: ZESTORETIC Take 1 tablet by mouth daily.   meclizine 25 MG tablet Commonly known as: ANTIVERT Take 1 tablet (25 mg total) by mouth every 8 (eight) hours as needed (vertigo).   metoprolol succinate 50 MG 24 hr tablet Commonly known as: TOPROL-XL TAKE ONE TABLET BY MOUTH ONCE DAILY TAKE WITH OR IMMEDIATELY FOLLOWING A MEAL   multivitamin capsule Take 1 capsule by mouth daily.   omeprazole 40 MG capsule Commonly known as: PRILOSEC TAKE ONE CAPSULE BY MOUTH ONCE DAILY  rosuvastatin 10 MG tablet Commonly known as: CRESTOR TAKE ONE TABLET BY MOUTH ONCE DAILY   tadalafil 5 MG tablet Commonly known as: CIALIS Take 1-2 tablets (5-10 mg total) by mouth daily.       Allergies: No Known Allergies  Family History: Family History  Problem Relation Age of Onset  . Cancer Mother        Dementia & Schizophrenia  . CAD Father     Social History:  reports that he has quit smoking. He has never used smokeless tobacco. He reports that he does not drink alcohol and does not use drugs.  ROS: Pertinent ROS in HPI  Physical Exam: BP (!) 145/83   Pulse 62   Ht _0  (1.803 m)   Wt 250 lb (113.4 kg)   BMI 34.87 kg/m   Constitutional:  Well  nourished. Alert and oriented, No acute distress. HEENT: [place] AT, mask in place.  Trachea midline Cardiovascular: No clubbing, cyanosis, or edema. Respiratory: Normal respiratory effort, no increased work of breathing. Neurologic: Grossly intact, no focal deficits, moving all 4 extremities. Psychiatric: Normal mood and affect.  Laboratory Data: Lab Results  Component Value Date   WBC 12.4 (H) 04/09/2020   HGB 16.0 04/09/2020   HCT 48.1 04/09/2020   MCV 85 04/09/2020   PLT 247 04/09/2020    Lab Results  Component Value Date   CREATININE 1.13 04/09/2020    Lab Results  Component Value Date   PSA 0.9 10/16/2014   Component     Latest Ref Rng & Units 10/19/2015 10/21/2016 10/30/2017 01/07/2019  Prostate Specific Ag, Serum     0.0 - 4.0 ng/mL 0.2 0.1 0.2 0.2   Component     Latest Ref Rng & Units 01/20/2020  Prostate Specific Ag, Serum     0.0 - 4.0 ng/mL 0.2    Lab Results  Component Value Date   HGBA1C 6.1 (H) 04/09/2020    Lab Results  Component Value Date   TSH 1.570 04/09/2020       Component Value Date/Time   CHOL 197 01/20/2020 0936   HDL 44 01/20/2020 0936   CHOLHDL 4.5 01/20/2020 0936   LDLCALC 122 (H) 01/20/2020 0936    Lab Results  Component Value Date   AST 18 04/09/2020   Lab Results  Component Value Date   ALT 21 04/09/2020   Specimen:  Blood  Ref Range & Units 2 mo ago Comments  Triglycerides 0 - 150 mg/dL 149    Cholesterol <=200 mg/dL 169    HDL 40 - 60 mg/dL 33Low    LDL Calculated 40 - 99 mg/dL 106High  NHLBI Recommended Ranges, LDL Cholesterol, for Adults (20+yrs) (ATPIII), mg/dL  Optimal       <100  Near Optimal    100-129  Borderline High   130-159  High        160-189  Very High      >=190  NHLBI Recommended Ranges, LDL Cholesterol, for Children (2-19 yrs), mg/dL  Desirable      <110  Borderline High   110-129  High         >=130  VLDL Cholesterol Cal 12 - 42 mg/dL 29.8    Chol/HDL  Ratio 1.0 - 4.5 5.1High    Non-HDL Cholesterol 70 - 130 mg/dL 136High  Non-HDL Cholesterol Recommended Ranges (mg/dL)  Optimal   <130  Near Optimal 130 - 159  Borderline High 160 - 189  High      190 -  219  Very High   >220  FASTING  Unknown    Resulting Agency  [facility]   Specimen Collected: 04/21/20 9:09 AM Last Resulted: 04/21/20 10:41 AM  Received From: [place]  Result Received: 04/22/20 8:30 AM   Specimen:  Blood  Ref Range & Units 10 d ago  Glucose 70 - 110 mg/dL 107   Sodium 136 - 145 mmol/L 138   Potassium 3.6 - 5.1 mmol/L 3.9   Chloride 97 - 109 mmol/L 100   Carbon Dioxide (CO2) 22.0 - 32.0 mmol/L 36.3High   Urea Nitrogen (BUN) 7 - 25 mg/dL 14   Creatinine 0.7 - 1.3 mg/dL 1.2   Glomerular Filtration Rate (eGFR), MDRD Estimate >60 mL/min/1.73sq m 74   Calcium 8.7 - 10.3 mg/dL 9.7   AST  8 - 39 U/L 19   ALT  6 - 57 U/L 22   Alk Phos (alkaline Phosphatase) 34 - 104 U/L 106High   Albumin 3.5 - 4.8 g/dL 4.6   Bilirubin, Total 0.3 - 1.2 mg/dL 0.7   Protein, Total 6.1 - 7.9 g/dL 7.1   A/G Ratio 1.0 - 5.0 gm/dL 1.8   Resulting Agency  [facility] - LAB  Specimen Collected: 07/06/20 4:31 PM Last Resulted: 07/06/20 5:42 PM  Received From: [place]  Result Received: 07/16/20 12:00 AM     Specimen:  Blood  Ref Range & Units 10 d ago  WBC (White Blood Cell Count) 4.1 - 10.2 10^3/uL 9.7   RBC (Red Blood Cell Count) 4.69 - 6.13 10^6/uL 4.99   Hemoglobin 14.1 - 18.1 gm/dL 14.2   Hematocrit 40.0 - 52.0 % 42.1   MCV (Mean Corpuscular Volume) 80.0 - 100.0 fl 84.4   MCH (Mean Corpuscular Hemoglobin) 27.0 - 31.2 pg 28.5   MCHC (Mean Corpuscular Hemoglobin Concentration) 32.0 - 36.0 gm/dL 33.7   Platelet Count 150 - 450 10^3/uL 235   RDW-CV (Red Cell Distribution Width) 11.6 - 14.8 % 12.7   MPV (Mean Platelet Volume) 9.4 - 12.4 fl 8.9Low   Neutrophils 1.50 - 7.80 10^3/uL 6.26   Lymphocytes  1.00 - 3.60 10^3/uL 2.24   Monocytes 0.00 - 1.50 10^3/uL 0.60   Eosinophils 0.00 - 0.55 10^3/uL 0.48   Basophils 0.00 - 0.09 10^3/uL 0.08   Neutrophil % 32.0 - 70.0 % 64.7   Lymphocyte % 10.0 - 50.0 % 23.1   Monocyte % 4.0 - 13.0 % 6.2   Eosinophil % 1.0 - 5.0 % 5.0   Basophil% 0.0 - 2.0 % 0.8   Immature Granulocyte % <=0.7 % 0.2   Immature Granulocyte Count <=0.06 10^3/L 0.02   Resulting Agency  [place] - LAB  Specimen Collected: 07/06/20 4:31 PM Last Resulted: 07/06/20 4:43 PM  Received From: [place]  Result Received: 07/16/20 12:00 AM   Specimen:  Blood  Ref Range & Units 10 d ago  Thyroid Stimulating Hormone (TSH) 0.450-5.330 uIU/ml uIU/mL 1.780   Resulting Agency  [facility] - LAB  Specimen Collected: 07/06/20 4:31 PM Last Resulted: 07/06/20 6:13 PM  Received From: [place]  Result Received: 07/16/20 12:00 AM   Urinalysis Component     Latest Ref Rng & Units 07/16/2020  Color, Urine     YELLOW YELLOW  Appearance     CLEAR CLEAR  Specific Gravity, Urine     1.005 - 1.030 1.020  pH     5.0 - 8.0 6.5  Glucose, UA     NEGATIVE mg/dL NEGATIVE  Hgb urine dipstick     NEGATIVE NEGATIVE  Bilirubin Urine     NEGATIVE NEGATIVE  Ketones, ur     NEGATIVE mg/dL NEGATIVE  Protein     NEGATIVE mg/dL NEGATIVE  Nitrite     NEGATIVE NEGATIVE  Leukocytes,Ua     NEGATIVE NEGATIVE  Squamous Epithelial / LPF     0 - 5 NONE SEEN  WBC, UA     0 - 5 WBC/hpf NONE SEEN  RBC / HPF     0 - 5 RBC/hpf NONE SEEN  Bacteria, UA     NONE SEEN NONE SEEN   I have reviewed the labs.   Assessment & Plan:    1. Erectile dysfunction - SHIM score is 15 - Continue Cialis 5 mg daily - will call when ready to send in refill for Cialis 5 mg daily, one year supply to [facility] - RTC in 12 months for repeat SHIM score and exam    Return in about 1 year (around 07/16/2021) for IPSS, SHIM and exam.  These notes  generated with voice recognition software. I apologize for typographical errors.  Zara Council, PA-C  [facility] [address]  [place], [place] [address] [phone]

## 2020-07-16 NOTE — Progress Notes (Incomplete)
07/16/2020 12:00 AM   [name] [birth year] [id number]  Referring provider: Birdie Sons, MD [address] [place],  [place] [address]  No chief complaint on file.   HPI: ***   Reviewed referral notes.    PMH: Past Medical History:  Diagnosis Date  . Costochondritis 09/12/2015  . GERD (gastroesophageal reflux disease)   . Hypertension     Surgical History: Past Surgical History:  Procedure Laterality Date  . APPENDECTOMY  1976  . COLONOSCOPY WITH PROPOFOL N/A 11/20/2016   Procedure: COLONOSCOPY WITH PROPOFOL;  Surgeon: Jonathon Bellows, MD;  Location: [facility] ENDOSCOPY;  Service: Endoscopy;  Laterality: N/A;  . echocardiogram  10/02/2010  . Myocardial perfusion scan  10/02/2010   normal perfusion. Normal systolic function. Borderline right ventricular dilation. EF>65%  . TOE SURGERY  2005    Home Medications:  Allergies as of 07/16/2020   No Known Allergies     Medication List       Accurate as of July 16, 2020 12:00 AM. If you have any questions, ask your nurse or doctor.        aspirin 81 MG tablet Take 81 mg by mouth daily.   gabapentin 100 MG capsule Commonly known as: NEURONTIN Take 1 capsule (100 mg total) by mouth 3 (three) times daily.   hydrocortisone-pramoxine 2.5-1 % rectal cream Commonly known as: Analpram HC Place 1 application rectally 3 (three) times daily.   lisinopril-hydrochlorothiazide 20-25 MG tablet Commonly known as: ZESTORETIC Take 1 tablet by mouth daily.   meclizine 25 MG tablet Commonly known as: ANTIVERT Take 1 tablet (25 mg total) by mouth every 8 (eight) hours as needed (vertigo).   metoprolol succinate 50 MG 24 hr tablet Commonly known as: TOPROL-XL TAKE ONE TABLET BY MOUTH ONCE DAILY TAKE WITH OR IMMEDIATELY FOLLOWING A MEAL   multivitamin capsule Take 1 capsule by mouth daily.   omeprazole 40 MG capsule Commonly known as: PRILOSEC TAKE ONE CAPSULE BY MOUTH ONCE DAILY   rosuvastatin 10 MG  tablet Commonly known as: CRESTOR TAKE ONE TABLET BY MOUTH ONCE DAILY   tadalafil 5 MG tablet Commonly known as: CIALIS Take 1-2 tablets (5-10 mg total) by mouth daily.       Allergies: No Known Allergies  Family History: Family History  Problem Relation Age of Onset  . Cancer Mother        Dementia & Schizophrenia  . CAD Father     Social History:  reports that he has quit smoking. He has never used smokeless tobacco. He reports that he does not drink alcohol and does not use drugs.  ROS: Pertinent ROS in HPI  Physical Exam: There were no vitals taken for this visit.  Constitutional:  Well nourished. Alert and oriented, No acute distress. HEENT: [place] AT, moist mucus membranes.  Trachea midline, no masses. Cardiovascular: No clubbing, cyanosis, or edema. Respiratory: Normal respiratory effort, no increased work of breathing. GI: Abdomen is soft, non tender, non distended, no abdominal masses. Liver and spleen not palpable.  No hernias appreciated.  Stool sample for occult testing is not indicated.   GU: No CVA tenderness.  No bladder fullness or masses.  Patient with circumcised/uncircumcised phallus. ***Foreskin easily retracted***  Urethral meatus is patent.  No penile discharge. No penile lesions or rashes. Scrotum without lesions, cysts, rashes and/or edema.  Testicles are located scrotally bilaterally. No masses are appreciated in the testicles. Left and right epididymis are normal. Rectal: Patient with  normal sphincter tone.  Anus and perineum without scarring or rashes. No rectal masses are appreciated. Prostate is approximately *** grams, *** nodules are appreciated. Seminal vesicles are normal. Skin: No rashes, bruises or suspicious lesions. Lymph: No cervical or inguinal adenopathy. Neurologic: Grossly intact, no focal deficits, moving all 4 extremities. Psychiatric: Normal mood and affect.  Laboratory Data: Lab Results  Component Value Date   WBC 12.4 (H)  04/09/2020   HGB 16.0 04/09/2020   HCT 48.1 04/09/2020   MCV 85 04/09/2020   PLT 247 04/09/2020    Lab Results  Component Value Date   CREATININE 1.13 04/09/2020    Lab Results  Component Value Date   PSA 0.9 10/16/2014   Component     Latest Ref Rng & Units 10/19/2015 10/21/2016 10/30/2017 01/07/2019  Prostate Specific Ag, Serum     0.0 - 4.0 ng/mL 0.2 0.1 0.2 0.2   Component     Latest Ref Rng & Units 01/20/2020  Prostate Specific Ag, Serum     0.0 - 4.0 ng/mL 0.2    Lab Results  Component Value Date   HGBA1C 6.1 (H) 04/09/2020    Lab Results  Component Value Date   TSH 1.570 04/09/2020       Component Value Date/Time   CHOL 197 01/20/2020 0936   HDL 44 01/20/2020 0936   CHOLHDL 4.5 01/20/2020 0936   LDLCALC 122 (H) 01/20/2020 0936    Lab Results  Component Value Date   AST 18 04/09/2020   Lab Results  Component Value Date   ALT 21 04/09/2020    Urinalysis No results found for: COLORURINE, APPEARANCEUR, LABSPEC, PHURINE, GLUCOSEU, HGBUR, BILIRUBINUR, KETONESUR, PROTEINUR, UROBILINOGEN, NITRITE, LEUKOCYTESUR  I have reviewed the labs.   Pertinent Imaging: @CT @ @ultrasound @ @KUB @ I have independently reviewed the films.    Assessment & Plan:  ***  There are no diagnoses linked to this encounter.  No follow-ups on file.  These notes generated with voice recognition software. I apologize for typographical errors.  Zara Council, PA-C  [facility] [address]  [place], [place] [address] [phone]

## 2020-08-06 ENCOUNTER — Other Ambulatory Visit: Payer: Self-pay | Admitting: Family Medicine

## 2020-08-06 MED ORDER — TADALAFIL 5 MG PO TABS: 5.0000 mg | ORAL_TABLET | Freq: Every day | ORAL | 6 refills | Status: DC

## 2020-08-06 NOTE — Telephone Encounter (Signed)
Patient called and states he wants a refill of his medication sent to [facility] in [place]. I asked patient what medication and he said it starts with a T. Tadalafil is the only medication I see in his chart. He states he was getting the medicine from [place] at [place]. I do not see anything on his med list sent by [facility] to [place]. I see a medication sent to [facility] from Dr. Diamantina [place]. Patient states he is confused on what he needs and he will call our office back when he figures out what he needs.

## 2020-09-14 ENCOUNTER — Other Ambulatory Visit: Payer: Self-pay | Admitting: Family Medicine

## 2020-09-14 DIAGNOSIS — I10 Essential (primary) hypertension: Secondary | ICD-10-CM

## 2020-09-14 NOTE — Telephone Encounter (Signed)
Requested Prescriptions  Pending Prescriptions Disp Refills  . rosuvastatin (CRESTOR) 10 MG tablet [Pharmacy Med Name: rosuvastatin 10 mg tablet] 30 tablet 10    Sig: TAKE ONE TABLET BY MOUTH ONCE DAILY     Cardiovascular:  Antilipid - Statins Failed - 09/14/2020 11:43 AM      Failed - LDL in normal range and within 360 days    LDL Chol Calc (NIH)  Date Value Ref Range Status  01/20/2020 122 (H) 0 - 99 mg/dL Final         Failed - Triglycerides in normal range and within 360 days    Triglycerides  Date Value Ref Range Status  01/20/2020 176 (H) 0 - 149 mg/dL Final         Passed - Total Cholesterol in normal range and within 360 days    Cholesterol, Total  Date Value Ref Range Status  01/20/2020 197 100 - 199 mg/dL Final         Passed - HDL in normal range and within 360 days    HDL  Date Value Ref Range Status  01/20/2020 44 >39 mg/dL Final         Passed - Patient is not pregnant      Passed - Valid encounter within last 12 months    Recent Outpatient Visits          4 months ago [place], Donald E, MD   5 months ago Acute otitis externa of both ears, unspecified type   [facility] Flinchum, Kelby Aline, FNP   5 months ago Hypertension, unspecified type   [facility], Kelby Aline, FNP   7 months ago Annual physical exam   [facility] Birdie Sons, MD   1 year ago Annual physical exam   [facility] Birdie Sons, MD      Future Appointments            In 10 months McGowan, Gordan Payment [place]           . lisinopril-hydrochlorothiazide (ZESTORETIC) 20-25 MG tablet [Pharmacy Med Name: lisinopril 20 mg-hydrochlorothiazide 25 mg tablet] 30 tablet 2    Sig: TAKE ONE TABLET BY MOUTH ONCE DAILY     Cardiovascular:  ACEI + Diuretic Combos Failed - 09/14/2020 11:43 AM      Failed - Ca in normal range and within 180 days     Calcium  Date Value Ref Range Status  04/09/2020 10.3 (H) 8.6 - 10.2 mg/dL Final   Calcium, Total  Date Value Ref Range Status  07/26/2012 9.4 8.5 - 10.1 mg/dL Final         Failed - Last BP in normal range    BP Readings from Last 1 Encounters:  07/16/20 (!) 145/83         Passed - Na in normal range and within 180 days    Sodium  Date Value Ref Range Status  04/09/2020 141 134 - 144 mmol/L Final  07/26/2012 139 136 - 145 mmol/L Final         Passed - K in normal range and within 180 days    Potassium  Date Value Ref Range Status  04/09/2020 4.4 3.5 - 5.2 mmol/L Final  07/26/2012 4.1 3.5 - 5.1 mmol/L Final         Passed - Cr in normal range and within 180 days    Creatinine  Date Value  Ref Range Status  07/26/2012 1.12 0.60 - 1.30 mg/dL Final   Creatinine, Ser  Date Value Ref Range Status  04/09/2020 1.13 0.76 - 1.27 mg/dL Final         Passed - Patient is not pregnant      Passed - Valid encounter within last 6 months    Recent Outpatient Visits          4 months ago [place], Donald E, MD   5 months ago Acute otitis externa of both ears, unspecified type   [facility] Flinchum, Kelby Aline, FNP   5 months ago Hypertension, unspecified type   [facility], Kelby Aline, FNP   7 months ago Annual physical exam   [facility] Birdie Sons, MD   1 year ago Annual physical exam   [facility] Birdie Sons, MD      Future Appointments            In 10 months McGowan, Shannon A, [place]

## 2020-10-10 ENCOUNTER — Ambulatory Visit (INDEPENDENT_AMBULATORY_CARE_PROVIDER_SITE_OTHER): Payer: BC Managed Care – PPO | Admitting: Podiatry

## 2020-10-10 ENCOUNTER — Other Ambulatory Visit: Payer: Self-pay

## 2020-10-10 ENCOUNTER — Other Ambulatory Visit: Payer: Self-pay | Admitting: Podiatry

## 2020-10-10 ENCOUNTER — Ambulatory Visit (INDEPENDENT_AMBULATORY_CARE_PROVIDER_SITE_OTHER): Payer: BC Managed Care – PPO

## 2020-10-10 ENCOUNTER — Encounter: Payer: Self-pay | Admitting: Podiatry

## 2020-10-10 DIAGNOSIS — T8484XA Pain due to internal orthopedic prosthetic devices, implants and grafts, initial encounter: Secondary | ICD-10-CM

## 2020-10-10 DIAGNOSIS — M778 Other enthesopathies, not elsewhere classified: Secondary | ICD-10-CM

## 2020-10-10 DIAGNOSIS — L600 Ingrowing nail: Secondary | ICD-10-CM

## 2020-10-10 DIAGNOSIS — M779 Enthesopathy, unspecified: Secondary | ICD-10-CM | POA: Diagnosis not present

## 2020-10-10 MED ORDER — NEOMYCIN-POLYMYXIN-HC 1 % OT SOLN: OTIC | 1 refills | Status: DC

## 2020-10-10 NOTE — Progress Notes (Signed)
Subjective:  Patient ID: Keith Villegas, male    DOB: 1956-04-03,  MRN: [id number] HPI Chief Complaint  Patient presents with  . Foot Pain    1st toe/MPJ left - shooting pains coming from big toe, lateral border swollen and sometimes gets really red, previous bunion surgery  . New Patient (Initial Visit)    Est pt 10/2017    65 y.o. male presents with the above complaint.   ROS: Denies fever chills nausea muscle aches pains calf pain back pain chest pain shortness of breath.  Is also requesting new orthotics.  Past Medical History:  Diagnosis Date  . Costochondritis 09/12/2015  . GERD (gastroesophageal reflux disease)   . Hypertension    Past Surgical History:  Procedure Laterality Date  . APPENDECTOMY  1976  . COLONOSCOPY WITH PROPOFOL N/A 11/20/2016   Procedure: COLONOSCOPY WITH PROPOFOL;  Surgeon: Jonathon Bellows, MD;  Location: [facility] ENDOSCOPY;  Service: Endoscopy;  Laterality: N/A;  . echocardiogram  10/02/2010  . Myocardial perfusion scan  10/02/2010   normal perfusion. Normal systolic function. Borderline right ventricular dilation. EF>65%  . TOE SURGERY  2005    Current Outpatient Medications:  .  NEOMYCIN-POLYMYXIN-HYDROCORTISONE (CORTISPORIN) 1 % SOLN OTIC solution, Apply 1-2 drops to toe BID after soaking, Disp: 10 mL, Rfl: 1 .  aspirin 81 MG tablet, Take 81 mg by mouth daily. , Disp: , Rfl:  .  gabapentin (NEURONTIN) 100 MG capsule, Take 1 capsule (100 mg total) by mouth 3 (three) times daily., Disp: 90 capsule, Rfl: 1 .  hydrocortisone-pramoxine (ANALPRAM HC) 2.5-1 % rectal cream, Place 1 application rectally 3 (three) times daily., Disp: 30 g, Rfl: 1 .  lisinopril-hydrochlorothiazide (ZESTORETIC) 20-25 MG tablet, TAKE ONE TABLET BY MOUTH ONCE DAILY, Disp: 30 tablet, Rfl: 2 .  meclizine (ANTIVERT) 25 MG tablet, Take 1 tablet (25 mg total) by mouth every 8 (eight) hours as needed (vertigo)., Disp: 30 tablet, Rfl: 0 .  metoprolol succinate (TOPROL-XL) 50 MG 24 hr tablet,  TAKE ONE TABLET BY MOUTH ONCE DAILY TAKE WITH OR IMMEDIATELY FOLLOWING A MEAL, Disp: 30 tablet, Rfl: 12 .  Multiple Vitamin (MULTIVITAMIN) capsule, Take 1 capsule by mouth daily., Disp: , Rfl:  .  omeprazole (PRILOSEC) 40 MG capsule, TAKE ONE CAPSULE BY MOUTH ONCE DAILY, Disp: 90 capsule, Rfl: 3 .  rosuvastatin (CRESTOR) 10 MG tablet, TAKE ONE TABLET BY MOUTH ONCE DAILY, Disp: 30 tablet, Rfl: 10 .  tadalafil (CIALIS) 5 MG tablet, Take 1-2 tablets (5-10 mg total) by mouth daily., Disp: 30 tablet, Rfl: 6  No Known Allergies Review of Systems Objective:  There were no vitals filed for this visit.  General: Well developed, nourished, in no acute distress, alert and oriented x3   Dermatological: Skin is warm, dry and supple bilateral. Nails x 10 are well maintained; remaining integument appears unremarkable at this time. There are no open sores, no preulcerative lesions, no rash or signs of infection present.  Sharp incurvated nail margin tibiofibular border of the hallux left.  Palpation of this area results in radiating pain proximally across his surgical site.  Vascular: Dorsalis Pedis artery and Posterior Tibial artery pedal pulses are 2/4 bilateral with immedate capillary fill time. Pedal hair growth present. No varicosities and no lower extremity edema present bilateral.   Neruologic: Grossly intact via light touch bilateral. Vibratory intact via tuning fork bilateral. Protective threshold with Semmes Wienstein monofilament intact to all pedal sites bilateral. Patellar and Achilles deep tendon reflexes 2+ bilateral. No Babinski or clonus  noted bilateral.   Musculoskeletal: No gross boney pedal deformities bilateral. No pain, crepitus, or limitation noted with foot and ankle range of motion bilateral. Muscular strength 5/5 in all groups tested bilateral.  No palpable pain around the first metatarsal no reproducible pain.  No palpable internal fixation.  Gait: Unassisted, Nonantalgic.     Radiographs:  Radiographs taken today do not demonstrate any type of internal fixation that is loose or has backed up or raised.  Assessment & Plan:   Assessment: Ingrown nail tibial fibular border hallux left  Plan: Chemical matricectomy was performed tibiofemoral border of the hallux left today after local anesthetic was administered he tolerated procedure well without complications.  He received both oral and written home-going instructions for care and soaking of the toe as well as a prescription for Corticosporin otic to be applied twice daily after soaking.  I also reordered his orthotics for him.  I will follow-up with him in about 1 week.     Zamyiah Tino T. [place], [place]

## 2020-10-10 NOTE — Patient Instructions (Signed)

## 2020-10-11 ENCOUNTER — Encounter: Payer: Self-pay | Admitting: Podiatry

## 2020-10-24 ENCOUNTER — Ambulatory Visit: Payer: BC Managed Care – PPO | Admitting: Podiatry

## 2020-10-24 ENCOUNTER — Other Ambulatory Visit: Payer: Self-pay

## 2020-10-24 ENCOUNTER — Encounter: Payer: Self-pay | Admitting: Podiatry

## 2020-10-24 DIAGNOSIS — Z9889 Other specified postprocedural states: Secondary | ICD-10-CM

## 2020-10-24 DIAGNOSIS — L600 Ingrowing nail: Secondary | ICD-10-CM

## 2020-10-24 DIAGNOSIS — M778 Other enthesopathies, not elsewhere classified: Secondary | ICD-10-CM

## 2020-10-24 NOTE — Progress Notes (Signed)
He presents today for follow-up of his matrixectomy hallux left.  States he has been soaking twice a day every day and utilize his Corticosporin otic.  He is also here to pick up his orthotics today.  Objective: Vital signs are stable he is alert and oriented x3 there is no erythema edema cellulitis drainage or odor matricectomy sites to the left hallux are healing up and scabbing over.  There is mild tenderness on palpation of the proximal nail fold.  Orthotics appear to fit the sole of the foot.  He did not want to put them in his shoes and walk around at this point but he will do so.  We discussed the break-in with him he understands and is amenable to it.  Assessment: Well-healing surgical toe.  Plan: Continue soak Epson salt warm water every other day leave open at bedtime but cover during the day.

## 2020-12-10 ENCOUNTER — Telehealth: Payer: Self-pay

## 2020-12-10 ENCOUNTER — Other Ambulatory Visit: Payer: Self-pay | Admitting: Family Medicine

## 2020-12-10 DIAGNOSIS — M545 Low back pain, unspecified: Secondary | ICD-10-CM

## 2020-12-10 DIAGNOSIS — I10 Essential (primary) hypertension: Secondary | ICD-10-CM

## 2020-12-10 NOTE — Telephone Encounter (Signed)
Copied from [place] [id number]. Topic: Referral - Request for Referral >> Dec 10, 2020 11:22 AM Celene Kras wrote: Has patient seen PCP for this complaint? Yes.   *If NO, is insurance requiring patient see PCP for this issue before PCP can refer them? Referral for which specialty: Back and Spine  Preferred provider/office: [facility] (Sp[facility]) Reason for referral: Pt called states that he is having pain in back and they are requesting to have a new referral. Please advise.

## 2021-01-15 ENCOUNTER — Ambulatory Visit: Payer: Self-pay | Admitting: *Deleted

## 2021-01-15 NOTE — Telephone Encounter (Signed)
  Patient called to report he twisted and turned leg last week and knee popped. Patient wants advise.  Called patient to review sx. Last Tuesday or Wednesday 7/19 or 01/09/21 walking and turned around and left knee popped. Sharp pain shoots up and down left leg. Pain and pressure putting weight on left knee. Limping and difficulty walking at work. Job requires walking all day. Patient would like orthopedic referral if PCP feels necessary to only have one dr. Appt if possible. Please advise if appt required with PCP. Patient can receive messages from PCP via My Chart per patient. My Chart access not noted in chart from NT. Care advise given. Patient verbalized understanding of care advise and to call back or go to [facility] or ED if symptoms worsen.     Reason for Disposition  [1] MODERATE pain (e.g., interferes with normal activities, limping) AND [2] present > 3 days  Answer Assessment - Initial Assessment Questions 1. LOCATION and RADIATION: "Where is the pain located?"      Left knee 2. QUALITY: "What does the pain feel like?"  (e.g., sharp, dull, aching, burning)     Sharp pain, shoots up and down leg 3. SEVERITY: "How bad is the pain?" "What does it keep you from doing?"   (Scale 1-10; or mild, moderate, severe)   -  MILD (1-3): doesn't interfere with normal activities    -  MODERATE (4-7): interferes with normal activities (e.g., work or school) or awakens from sleep, limping    -  SEVERE (8-10): excruciating pain, unable to do any normal activities, unable to walk     Moderate , limping  4. ONSET: "When did the pain start?" "Does it come and go, or is it there all the time?"     Last Tuesday/ Wednesday 7/19 or 01/09/21 5. RECURRENT: "Have you had this pain before?" If Yes, ask: "When, and what happened then?"     na 6. SETTING: "Has there been any recent work, exercise or other activity that involved that part of the body?"      Was walking and turned and twisted left knee and felt a pop. 7.  AGGRAVATING FACTORS: "What makes the knee pain worse?" (e.g., walking, climbing stairs, running)     Walking , climbing stairs 8. ASSOCIATED SYMPTOMS: "Is there any swelling or redness of the knee?"     Not reported  9. OTHER SYMPTOMS: "Do you have any other symptoms?" (e.g., chest pain, difficulty breathing, fever, calf pain)     Pressure from knee with walking , standing,  10. PREGNANCY: "Is there any chance you are pregnant?" "When was your last menstrual period?"       naa  Protocols used: Knee Pain-A-AH

## 2021-01-16 NOTE — Telephone Encounter (Signed)
Tried calling patient. Left message to call back. OK for [facility] triage to advise patient of message.  [facility]  Address: [address] [place], [place], [place] [address]  Phone: [phone]  Hours:  Wednesday 8AM-9PM Thursday 8AM-9PM Friday             8AM-9PM Saturday 9AM-9PM Sunday 9AM-9PM Monday 8AM-9PM Tuesday 8AM-9PM

## 2021-01-16 NOTE — Telephone Encounter (Signed)
Fastest way to see orthopedist is for patient to Call [facility]. They can usually get in in 1 or 2 days. Please give patient contact information for [facility]

## 2021-01-17 NOTE — Telephone Encounter (Signed)
Patient notified of provider recommendation- he will call today

## 2021-01-17 NOTE — Telephone Encounter (Signed)
Pt wife called in stating the original referral was sent to a office in [place], and pt now wants it sent to the [facility] off [address]. Please advise.

## 2021-01-25 ENCOUNTER — Ambulatory Visit: Payer: Self-pay | Admitting: Family Medicine

## 2021-02-08 ENCOUNTER — Other Ambulatory Visit: Payer: Self-pay | Admitting: Family Medicine

## 2021-02-08 DIAGNOSIS — K219 Gastro-esophageal reflux disease without esophagitis: Secondary | ICD-10-CM

## 2021-02-08 DIAGNOSIS — I10 Essential (primary) hypertension: Secondary | ICD-10-CM

## 2021-03-05 ENCOUNTER — Other Ambulatory Visit: Payer: Self-pay

## 2021-03-05 ENCOUNTER — Encounter: Payer: Self-pay | Admitting: Family Medicine

## 2021-03-05 ENCOUNTER — Ambulatory Visit (INDEPENDENT_AMBULATORY_CARE_PROVIDER_SITE_OTHER): Payer: BC Managed Care – PPO | Admitting: Family Medicine

## 2021-03-05 VITALS — BP 162/71 | HR 63 | Ht 71.0 in | Wt 244.0 lb

## 2021-03-05 DIAGNOSIS — M25562 Pain in left knee: Secondary | ICD-10-CM

## 2021-03-05 DIAGNOSIS — Z0000 Encounter for general adult medical examination without abnormal findings: Secondary | ICD-10-CM

## 2021-03-05 DIAGNOSIS — E785 Hyperlipidemia, unspecified: Secondary | ICD-10-CM

## 2021-03-05 DIAGNOSIS — Z125 Encounter for screening for malignant neoplasm of prostate: Secondary | ICD-10-CM

## 2021-03-05 DIAGNOSIS — I10 Essential (primary) hypertension: Secondary | ICD-10-CM | POA: Diagnosis not present

## 2021-03-05 DIAGNOSIS — Z289 Immunization not carried out for unspecified reason: Secondary | ICD-10-CM

## 2021-03-05 DIAGNOSIS — R739 Hyperglycemia, unspecified: Secondary | ICD-10-CM

## 2021-03-05 MED ORDER — NAPROXEN 500 MG PO TABS: 500.0000 mg | ORAL_TABLET | Freq: Two times a day (BID) | ORAL | 2 refills | Status: DC | PRN

## 2021-03-05 MED ORDER — SHINGRIX 50 MCG/0.5ML IM SUSR: 0.5000 mL | Freq: Once | INTRAMUSCULAR | 0 refills | Status: AC

## 2021-03-05 NOTE — Patient Instructions (Addendum)
Please review the attached list of medications and notify my office if there are any errors.   The CDC recommends two doses of Shingrix (the shingles vaccine) separated by 2 to 6 months for adults age 64 years and older. I recommend checking with your pharmacy plan regarding coverage for this vaccine. You should one month after your last Covid booster.

## 2021-03-05 NOTE — Progress Notes (Signed)
Complete physical exam   Patient: Keith Villegas   DOB: [birth year]   65 y.o. Male  MRN: [id number] Visit Date: 03/05/2021  Today's healthcare provider: Lelon Huh, MD   Chief Complaint  Patient presents with   Annual Exam   Knee Pain   Subjective    Keith Villegas is a 65 y.o. male who presents today for a complete physical exam.  He reports consuming a general diet. Exercise is limited by orthopedic condition(s): left knee pain. He generally feels well. He reports sleeping well. He does have additional problems to discuss today.  Knee Pain  Incident onset: Started about two months ago. Injury mechanism: Pt states he was walking about two months ago and heard a "pop" The pain is present in the left knee. Associated symptoms include muscle weakness. Pertinent negatives include no inability to bear weight, loss of motion, loss of sensation, numbness or tingling. The symptoms are aggravated by weight bearing. He has tried acetaminophen for the symptoms.   He has seen been to orthopedics and had MRI showing extensive soft tissue damage to knee. States he had knee injection but currently has no specific follow up scheduled. He states it is much better than when he initially injured it, but still painful all day long and limits activity level.   He is also here for follow up hypertension. He does check his blood pressure at home a few days every week and it is consistently in the 130's/70s. He states if he doesn't take his blood pressure medication it goes up to the 160s/80s. He states he did not take his medication as usual today since he was coming in for his CPE.  Past Medical History:  Diagnosis Date   Costochondritis 09/12/2015   GERD (gastroesophageal reflux disease)    Hypertension    Past Surgical History:  Procedure Laterality Date   APPENDECTOMY  1976   COLONOSCOPY WITH PROPOFOL N/A 11/20/2016   Procedure: COLONOSCOPY WITH PROPOFOL;  Surgeon: Jonathon Bellows, MD;  Location: [facility]  ENDOSCOPY;  Service: Endoscopy;  Laterality: N/A;   echocardiogram  10/02/2010   Myocardial perfusion scan  10/02/2010   normal perfusion. Normal systolic function. Borderline right ventricular dilation. EF>65%   TOE SURGERY  2005   Social History   Socioeconomic History   Marital status: Married    Spouse name: Not on file   Number of children: 2   Years of education: Not on file   Highest education level: Not on file  Occupational History   Occupation: Visual merchandiser    Comment: works at [employer] Use   Smoking status: Former   Smokeless tobacco: Never  Scientific laboratory technician Use: Never used  Substance and Sexual Activity   Alcohol use: No    Alcohol/week: 0.0 standard drinks   Drug use: No   Sexual activity: Not on file  Other Topics Concern   Not on file  Social History Narrative   Not on file   Social Determinants of Health   Financial Resource Strain: Not on file  Food Insecurity: Not on file  Transportation Needs: Not on file  Physical Activity: Not on file  Stress: Not on file  Social Connections: Not on file  Intimate Partner Violence: Not on file   Family Status  Relation Name Status   Mother  Deceased       Cause of Death: COVID-50   Father  Deceased  35   Sister  Insurance risk surveyor   Brother  Alive   Neg Hx  (Not Specified)   Family History  Problem Relation Age of Onset   Cancer Mother        Dementia & Schizophrenia   CAD Father    Colon cancer Neg Hx    Prostate cancer Neg Hx    No Known Allergies  Patient Care Team: Birdie Sons, MD as PCP - General (Family Medicine) Garrel Ridgel, DPM as Consulting Physician (Podiatry) System, Provider Not In Edrick Kins, DPM as Consulting Physician (Podiatry) Johnnette Gourd, MD (Physical Medicine and Rehabilitation) Vladimir Crofts, MD as Consulting Physician (Neurology)   Medications: Outpatient Medications Prior to Visit  Medication Sig   aspirin 81 MG  tablet Take 81 mg by mouth daily.    gabapentin (NEURONTIN) 100 MG capsule Take 1 capsule (100 mg total) by mouth 3 (three) times daily.   hydrocortisone-pramoxine (ANALPRAM HC) 2.5-1 % rectal cream Place 1 application rectally 3 (three) times daily.   lisinopril-hydrochlorothiazide (ZESTORETIC) 20-25 MG tablet TAKE ONE TABLET BY MOUTH ONCE DAILY   meclizine (ANTIVERT) 25 MG tablet Take 1 tablet (25 mg total) by mouth every 8 (eight) hours as needed (vertigo).   metoprolol succinate (TOPROL-XL) 50 MG 24 hr tablet TAKE ONE TABLET BY MOUTH ONCE DAILY TAKE WITH OR IMMEDIATELY FOLLOWING A MEAL   Multiple Vitamin (MULTIVITAMIN) capsule Take 1 capsule by mouth daily.   NEOMYCIN-POLYMYXIN-HYDROCORTISONE (CORTISPORIN) 1 % SOLN OTIC solution Apply 1-2 drops to toe BID after soaking   omeprazole (PRILOSEC) 40 MG capsule TAKE ONE CAPSULE BY MOUTH ONCE DAILY   rosuvastatin (CRESTOR) 10 MG tablet TAKE ONE TABLET BY MOUTH ONCE DAILY   tadalafil (CIALIS) 5 MG tablet Take 1-2 tablets (5-10 mg total) by mouth daily.   No facility-administered medications prior to visit.    Review of Systems  Constitutional: Negative.   HENT: Negative.    Eyes: Negative.   Respiratory: Negative.    Cardiovascular: Negative.   Gastrointestinal: Negative.   Endocrine: Negative.   Genitourinary: Negative.   Musculoskeletal:  Positive for arthralgias and back pain. Negative for gait problem, joint swelling, myalgias, neck pain and neck stiffness.  Skin: Negative.   Allergic/Immunologic: Negative.   Neurological: Negative.  Negative for tingling and numbness.  Psychiatric/Behavioral: Negative.       Objective    BP (!) 162/71 (BP Location: Right Arm, Patient Position: Sitting, Cuff Size: Large)   Pulse 63   Ht '5\' 11"'$  (1.803 m)   Wt 244 lb (110.7 kg)   SpO2 100%   BMI 34.03 kg/m    Physical Exam    General Appearance:    Obese male. Alert, cooperative, in no acute distress, appears stated age  Head:     Normocephalic, without obvious abnormality, atraumatic  Eyes:    PERRL, conjunctiva/corneas clear, EOM's intact, fundi    benign, both eyes       Ears:    Normal TM's and external ear canals, both ears  Neck:   Supple, symmetrical, trachea midline, no adenopathy;       thyroid:  No enlargement/tenderness/nodules; no carotid   bruit or JVD  Back:     Symmetric, no curvature, ROM normal, no CVA tenderness  Lungs:     Clear to auscultation bilaterally, respirations unlabored  Chest wall:    No tenderness or deformity  Heart:    Normal heart rate. Normal rhythm. No murmurs, rubs,  or gallops.  S1 and S2 normal  Abdomen:     Soft, non-tender, bowel sounds active all four quadrants,    no masses, no organomegaly  Genitalia:    deferred  Rectal:    deferred  Extremities:   All extremities are intact. No cyanosis or edema  Pulses:   2+ and symmetric all extremities  Skin:   Skin color, texture, turgor normal, no rashes or lesions  Lymph nodes:   Cervical, supraclavicular, and axillary nodes normal  Neurologic:   CNII-XII intact. Normal strength, sensation and reflexes      throughout    Last depression screening scores PHQ 2/9 Scores 03/05/2021 04/09/2020 01/20/2020  PHQ - 2 Score 0 0 0  PHQ- 9 Score 0 - -   Last fall risk screening Fall Risk  03/05/2021  Falls in the past year? 0  Number falls in past yr: 0  Injury with Fall? 0  Risk for fall due to : No Fall Risks  Follow up Falls evaluation completed   Last Audit-C alcohol use screening Alcohol Use Disorder Test (AUDIT) 03/05/2021  1. How often do you have a drink containing alcohol? 0  2. How many drinks containing alcohol do you have on a typical day when you are drinking? 0  3. How often do you have six or more drinks on one occasion? 0  AUDIT-C Score 0  Alcohol Brief Interventions/Follow-up -   A score of 3 or more in women, and 4 or more in men indicates increased risk for alcohol abuse, EXCEPT if all of the points are from  question 1   No results found for any visits on 03/05/21.  Assessment & Plan    Routine Health Maintenance and Physical Exam  Exercise Activities and Dietary recommendations  Goals   None     Immunization History  Administered Date(s) Administered   Influenza Split 06/07/2011   Influenza,inj,Quad PF,6-35 Mos 04/06/2020   Influenza-Unspecified 04/10/2017, 04/07/2018   Janssen (J&J) SARS-COV-2 Vaccination 08/29/2019, 04/18/2020   Td 10/30/2017   Tdap 09/21/2007    Health Maintenance  Topic Date Due   Zoster Vaccines- Shingrix (1 of 2) Never done   COVID-19 Vaccine (3 - Booster for Janssen series) 08/19/2020   INFLUENZA VACCINE  09/20/2021 (Originally 01/21/2021)   COLONOSCOPY (Pts 45-52yr Insurance coverage will need to be confirmed)  11/20/2021   TETANUS/TDAP  10/31/2027   Hepatitis C Screening  Completed   HIV Screening  Completed   Pneumococcal Vaccine [id number] old  Aged Out   HPV VACCINES  Aged Out    Discussed health benefits of physical activity, and encouraged him to engage in regular exercise appropriate for his age and condition.  1. Annual physical exam   2. Prostate cancer screening  - PSA Total (Reflex To Free) ([facility] only)  3. Primary hypertension Home blood pressures reportedly in 130s/70s. Continue current medications.   - CBC - Lipid panel  4. Hyperlipidemia, unspecified hyperlipidemia type He is tolerating rosuvastatin well with no adverse effects.   - Comprehensive metabolic panel  5. Hyperglycemia  - Hemoglobin A1c  6. Left knee pain, unspecified chronicity Followed by orthopedics with ligament and meniscal injuries, slowly improving. Can take - naproxen (NAPROSYN) 500 MG tablet; Take 1 tablet (500 mg total) by mouth 2 (two) times daily as needed to help with mobility while healing. Also recommend OTC elastic knee brace when ambulating.    7. Prescription for Shingrix. Vaccine not administered in office.   - Zoster Vaccine  Adjuvanted  [facility]) injection; Inject 0.5 mLs into the muscle once for 1 dose.  Dispense: 0.5 mL; Refill: 0  He states he had flu and Omicron vaccine at [facility] yesterday.        The entirety of the information documented in the History of Present Illness, Review of Systems and Physical Exam were personally obtained by me. Portions of this information were initially documented by the CMA and reviewed by me for thoroughness and accuracy.     Lelon Huh, MD  [facility] [phone] (phone) [phone] (fax)  [place]

## 2021-03-06 ENCOUNTER — Telehealth: Payer: Self-pay

## 2021-03-06 LAB — COMPREHENSIVE METABOLIC PANEL
ALT: 20 IU/L (ref 0–44)
AST: 17 IU/L (ref 0–40)
Albumin/Globulin Ratio: 1.8 (ref 1.2–2.2)
Albumin: 4.6 g/dL (ref 3.8–4.8)
Alkaline Phosphatase: 105 IU/L (ref 44–121)
BUN/Creatinine Ratio: 15 (ref 10–24)
BUN: 18 mg/dL (ref 8–27)
Bilirubin Total: 0.6 mg/dL (ref 0.0–1.2)
CO2: 26 mmol/L (ref 20–29)
Calcium: 9.9 mg/dL (ref 8.6–10.2)
Chloride: 98 mmol/L (ref 96–106)
Creatinine, Ser: 1.22 mg/dL (ref 0.76–1.27)
Globulin, Total: 2.5 g/dL (ref 1.5–4.5)
Glucose: 107 mg/dL — ABNORMAL HIGH (ref 65–99)
Potassium: 4.1 mmol/L (ref 3.5–5.2)
Sodium: 140 mmol/L (ref 134–144)
Total Protein: 7.1 g/dL (ref 6.0–8.5)
eGFR: 66 mL/min/{1.73_m2} (ref >59)

## 2021-03-06 LAB — LIPID PANEL
Chol/HDL Ratio: 4.2 ratio (ref 0.0–5.0)
Cholesterol, Total: 222 mg/dL — ABNORMAL HIGH (ref 100–199)
HDL: 53 mg/dL (ref >39)
LDL Chol Calc (NIH): 141 mg/dL — ABNORMAL HIGH (ref 0–99)
Triglycerides: 157 mg/dL — ABNORMAL HIGH (ref 0–149)
VLDL Cholesterol Cal: 28 mg/dL (ref 5–40)

## 2021-03-06 LAB — PSA TOTAL (REFLEX TO FREE): Prostate Specific Ag, Serum: 0.3 ng/mL (ref 0.0–4.0)

## 2021-03-06 LAB — HEMOGLOBIN A1C
Est. average glucose Bld gHb Est-mCnc: 134 mg/dL
Hgb A1c MFr Bld: 6.3 % — ABNORMAL HIGH (ref 4.8–5.6)

## 2021-03-06 LAB — CBC
Hematocrit: 41.3 % (ref 37.5–51.0)
Hemoglobin: 14.7 g/dL (ref 13.0–17.7)
MCH: 29 pg (ref 26.6–33.0)
MCHC: 35.6 g/dL (ref 31.5–35.7)
MCV: 82 fL (ref 79–97)
Platelets: 215 10*3/uL (ref 150–450)
RBC: 5.07 x10E6/uL (ref 4.14–5.80)
RDW: 13.2 % (ref 11.6–15.4)
WBC: 10.6 10*3/uL (ref 3.4–10.8)

## 2021-03-06 MED ORDER — ROSUVASTATIN CALCIUM 20 MG PO TABS: 20.0000 mg | ORAL_TABLET | Freq: Every day | ORAL | 3 refills | Status: DC

## 2021-03-06 NOTE — Telephone Encounter (Signed)
Pt advised RX sent to [facility].  Apt scheduled for 06/07/2021  Thanks,   -Mickel Baas

## 2021-03-06 NOTE — Telephone Encounter (Signed)
-----   Message from Birdie Sons, MD sent at 03/06/2021  8:04 AM EDT ----- A1c is good at 6.3. But cholesterol is up to 222. Need to increase rosuvastatin to '20mg'$  once a day. Can send in prescription for #30 rf x 3. Need to schedule follow up for diabetes, hypertension, and cholesterol in 3-4 months.  Next time he comes in he can go ahead and take his blood pressure medications that morning before his appointment.

## 2021-03-07 ENCOUNTER — Other Ambulatory Visit: Payer: Self-pay | Admitting: Urology

## 2021-04-11 ENCOUNTER — Other Ambulatory Visit: Payer: Self-pay | Admitting: Family Medicine

## 2021-04-11 DIAGNOSIS — I10 Essential (primary) hypertension: Secondary | ICD-10-CM

## 2021-04-11 DIAGNOSIS — K219 Gastro-esophageal reflux disease without esophagitis: Secondary | ICD-10-CM

## 2021-04-12 ENCOUNTER — Other Ambulatory Visit: Payer: Self-pay | Admitting: Family Medicine

## 2021-04-12 NOTE — Telephone Encounter (Signed)
Requested Prescriptions  Pending Prescriptions Disp Refills  . omeprazole (PRILOSEC) 40 MG capsule [Pharmacy Med Name: omeprazole 40 mg capsule,delayed release] 90 capsule 0    Sig: TAKE ONE TABLET BY MOUTH ONCE DAILY     Gastroenterology: Proton Pump Inhibitors Passed - 04/11/2021  5:51 PM      Passed - Valid encounter within last 12 months    Recent Outpatient Visits          1 month ago Annual physical exam   [facility] Birdie Sons, MD   11 months ago [place], Kirstie Peri, MD   1 year ago Acute otitis externa of both ears, unspecified type   [facility] Flinchum, Kelby Aline, FNP   1 year ago Hypertension, unspecified type   [facility] Flinchum, Kelby Aline, [place]   1 year ago Annual physical exam   [facility] Birdie Sons, MD      Future Appointments            In 1 month [place], Kirstie Peri, MD [facility], [place]   In 3 months [place], Shannon A, [place]           . lisinopril-hydrochlorothiazide (ZESTORETIC) 20-25 MG tablet [Pharmacy Med Name: lisinopril 20 mg-hydrochlorothiazide 25 mg tablet] 90 tablet 0    Sig: TAKE ONE TABLET BY MOUTH ONCE DAILY     Cardiovascular:  ACEI + Diuretic Combos Failed - 04/11/2021  5:51 PM      Failed - Last BP in normal range    BP Readings from Last 1 Encounters:  03/05/21 (!) 162/71         Passed - Na in normal range and within 180 days    Sodium  Date Value Ref Range Status  03/05/2021 140 134 - 144 mmol/L Final  07/26/2012 139 136 - 145 mmol/L Final         Passed - K in normal range and within 180 days    Potassium  Date Value Ref Range Status  03/05/2021 4.1 3.5 - 5.2 mmol/L Final  07/26/2012 4.1 3.5 - 5.1 mmol/L Final         Passed - Cr in normal range and within 180 days    Creatinine  Date Value Ref Range Status  07/26/2012 1.12 0.60 - 1.30 mg/dL Final   Creatinine, Ser   Date Value Ref Range Status  03/05/2021 1.22 0.76 - 1.27 mg/dL Final         Passed - Ca in normal range and within 180 days    Calcium  Date Value Ref Range Status  03/05/2021 9.9 8.6 - 10.2 mg/dL Final   Calcium, Total  Date Value Ref Range Status  07/26/2012 9.4 8.5 - 10.1 mg/dL Final         Passed - Patient is not pregnant      Passed - Valid encounter within last 6 months    Recent Outpatient Visits          1 month ago Annual physical exam   [facility] Birdie Sons, MD   11 months ago [place], Donald E, MD   1 year ago Acute otitis externa of both ears, unspecified type   [place] [facility] Flinchum, Kelby Aline, FNP   1 year ago Hypertension, unspecified type   [facility] Flinchum, Kelby Aline, FNP   1 year ago Annual  physical exam   [place], MD      Future Appointments            In 1 month Fisher, Kirstie Peri, MD [facility], [place]   In 3 months [place]an, [facility] [place]           . metoprolol succinate (TOPROL-XL) 50 MG 24 hr tablet [Pharmacy Med Name: metoprolol succinate ER 50 mg tablet,extended release 24 hr] 90 tablet 0    Sig: TAKE ONE TABLET BY MOUTH ONCE DAILY TAKE WITH OR IMMEDIATELY FOLLOWING A MEAL     Cardiovascular:  Beta Blockers Failed - 04/11/2021  5:51 PM      Failed - Last BP in normal range    BP Readings from Last 1 Encounters:  03/05/21 (!) 162/71         Passed - Last Heart Rate in normal range    Pulse Readings from Last 1 Encounters:  03/05/21 63         Passed - Valid encounter within last 6 months    Recent Outpatient Visits          1 month ago Annual physical exam   [facility] Birdie Sons, MD   11 months ago [place], Kirstie Peri, MD   1 year ago Acute otitis externa of both ears, unspecified type   [facility] Flinchum, Kelby Aline, FNP   1 year ago Hypertension, unspecified type   [facility] Flinchum, Kelby Aline, FNP   1 year ago Annual physical exam   [facility] Birdie Sons, MD      Future Appointments            In 1 month Fisher, Kirstie Peri, MD [facility], [place]   In 3 months McGowan, Shannon A, [place]

## 2021-06-06 NOTE — Progress Notes (Deleted)
Established patient visit   Patient: Keith Villegas   DOB: September 16, 1955   65 y.o. Male  MRN: [id number] Visit Date: 06/07/2021  Today's healthcare provider: Lelon Huh, MD   No chief complaint on file.  Subjective    HPI  Prediabetes, Follow-up  Lab Results  Component Value Date   HGBA1C 6.3 (H) 03/05/2021   HGBA1C 6.1 (H) 04/09/2020   GLUCOSE 107 (H) 03/05/2021   GLUCOSE 122 (H) 04/09/2020   GLUCOSE 99 01/20/2020    Last seen for for this 3-4 months ago.  Management since that visit includes no changes. Current symptoms include none and have been stable. Current diet: in general, a "healthy" diet   Current exercise: walking  Pertinent Labs:    Component Value Date/Time   CHOL 222 (H) 03/05/2021 0938   TRIG 157 (H) 03/05/2021 0938   CHOLHDL 4.2 03/05/2021 0938   CREATININE 1.22 03/05/2021 0938   CREATININE 1.12 07/26/2012 0918    Wt Readings from Last 3 Encounters:  03/05/21 244 lb (110.7 kg)  07/16/20 250 lb (113.4 kg)  06/12/20 245 lb (111.1 kg)    -----------------------------------------------------------------------------------------  Hypertension, follow-up  BP Readings from Last 3 Encounters:  03/05/21 (!) 162/71  07/16/20 (!) 145/83  06/12/20 (!) 153/82   Wt Readings from Last 3 Encounters:  03/05/21 244 lb (110.7 kg)  07/16/20 250 lb (113.4 kg)  06/12/20 245 lb (111.1 kg)     He was last seen for hypertension 3 months ago.  BP at that visit was 162/71. Management since that visit includes no changes. He reports excellent compliance with treatment. He is not having side effects.  He is exercising. He is adherent to low salt diet.   Outside blood pressures are 120-130/65-70's.  He does not smoke.  Use of agents associated with hypertension: none.   --------------------------------------------------------------------------------------------------- Lipid/Cholesterol, follow-up  Last Lipid Panel: Lab Results  Component Value Date    CHOL 222 (H) 03/05/2021   LDLCALC 141 (H) 03/05/2021   HDL 53 03/05/2021   TRIG 157 (H) 03/05/2021    He was last seen for this 3 months ago.  Management since that visit includes increasing rosuvastatin to 55m daily .  He reports excellent compliance with treatment. He is not having side effects.   Symptoms: No appetite changes No foot ulcerations  No chest pain No chest pressure/discomfort  No dyspnea No orthopnea  No fatigue No lower extremity edema  No palpitations No paroxysmal nocturnal dyspnea  No nausea No numbness or tingling of extremity  No polydipsia No polyuria  No speech difficulty No syncope    Last metabolic panel Lab Results  Component Value Date   GLUCOSE 107 (H) 03/05/2021   NA 140 03/05/2021   K 4.1 03/05/2021   BUN 18 03/05/2021   CREATININE 1.22 03/05/2021   EGFR 66 03/05/2021   GFRNONAA 69 04/09/2020   CALCIUM 9.9 03/05/2021   AST 17 03/05/2021   ALT 20 03/05/2021   The 10-year ASCVD risk score (Arnett DK, et al., 2019) is: 21.4%  ---------------------------------------------------------------------------------------------------     Medications: Outpatient Medications Prior to Visit  Medication Sig   aspirin 81 MG tablet Take 81 mg by mouth daily.    gabapentin (NEURONTIN) 100 MG capsule Take 1 capsule (100 mg total) by mouth 3 (three) times daily.   hydrocortisone-pramoxine (ANALPRAM HC) 2.5-1 % rectal cream Place 1 application rectally 3 (three) times daily.   lisinopril-hydrochlorothiazide (ZESTORETIC) 20-25 MG tablet TAKE ONE TABLET BY MOUTH  ONCE DAILY   meclizine (ANTIVERT) 25 MG tablet Take 1 tablet (25 mg total) by mouth every 8 (eight) hours as needed (vertigo).   metoprolol succinate (TOPROL-XL) 50 MG 24 hr tablet TAKE ONE TABLET BY MOUTH ONCE DAILY TAKE WITH OR IMMEDIATELY FOLLOWING A MEAL   Multiple Vitamin (MULTIVITAMIN) capsule Take 1 capsule by mouth daily.   naproxen (NAPROSYN) 500 MG tablet TAKE ONE TABLET BY MOUTH TWICE  DAILY AS NEEDED   NEOMYCIN-POLYMYXIN-HYDROCORTISONE (CORTISPORIN) 1 % SOLN OTIC solution Apply 1-2 drops to toe BID after soaking   omeprazole (PRILOSEC) 40 MG capsule TAKE ONE TABLET BY MOUTH ONCE DAILY   rosuvastatin (CRESTOR) 20 MG tablet Take 1 tablet (20 mg total) by mouth daily.   tadalafil (CIALIS) 5 MG tablet TAKE 1 TO 2 TABLETSP O DAILY   No facility-administered medications prior to visit.    Review of Systems     Objective    There were no vitals taken for this visit.   Physical Exam  ***  No results found for any visits on 06/07/21.  Assessment & Plan     ***  No follow-ups on file.      {provider attestation***:1}   Lelon Huh, MD  [facility] [phone] (phone) [phone] (fax)  [place]

## 2021-06-07 ENCOUNTER — Encounter: Payer: Self-pay | Admitting: Family Medicine

## 2021-06-07 ENCOUNTER — Ambulatory Visit: Payer: BC Managed Care – PPO | Admitting: Family Medicine

## 2021-06-07 ENCOUNTER — Other Ambulatory Visit: Payer: Self-pay

## 2021-06-07 VITALS — BP 136/70 | HR 68 | Temp 98.3°F | Ht 71.0 in | Wt 239.9 lb

## 2021-06-07 DIAGNOSIS — I10 Essential (primary) hypertension: Secondary | ICD-10-CM | POA: Diagnosis not present

## 2021-06-07 DIAGNOSIS — E785 Hyperlipidemia, unspecified: Secondary | ICD-10-CM | POA: Diagnosis not present

## 2021-06-07 NOTE — Patient Instructions (Signed)
.   Please review the attached list of medications and notify my office if there are any errors.   . Please bring all of your medications to every appointment so we can make sure that our medication list is the same as yours.   

## 2021-06-07 NOTE — Progress Notes (Signed)
Established patient visit   Patient: Keith Villegas   DOB: 12-02-1955   65 y.o. Male  MRN: [id number] Visit Date: 06/07/2021  Today's healthcare provider: Lelon Huh, MD   Chief Complaint  Patient presents with   Hyperlipidemia    Subjective    HPI   -----------------------------------------------------------------------------------------  Hypertension, follow-up  BP Readings from Last 3 Encounters:  06/07/21 136/70  03/05/21 (!) 162/71  07/16/20 (!) 145/83   Wt Readings from Last 3 Encounters:  06/07/21 239 lb 14.4 oz (108.8 kg)  03/05/21 244 lb (110.7 kg)  07/16/20 250 lb (113.4 kg)     He was last seen for hypertension 3 months ago.  BP at that visit was 162/71. Management since that visit includes no changes. He reports excellent compliance with treatment. He is not having side effects.  He is exercising. He is adherent to low salt diet.   Outside blood pressures are 120-130/65-70's.  He does not smoke.  Use of agents associated with hypertension: none.   --------------------------------------------------------------------------------------------------- Lipid/Cholesterol, follow-up  Last Lipid Panel: Lab Results  Component Value Date   CHOL 222 (H) 03/05/2021   LDLCALC 141 (H) 03/05/2021   HDL 53 03/05/2021   TRIG 157 (H) 03/05/2021    He was last seen for this 3 months ago.  Management since that visit includes increasing rosuvastatin to 20mg  daily .  He reports excellent compliance with treatment. He is not having side effects.   Symptoms: No appetite changes No foot ulcerations  No chest pain No chest pressure/discomfort  No dyspnea No orthopnea  No fatigue No lower extremity edema  No palpitations No paroxysmal nocturnal dyspnea  No nausea No numbness or tingling of extremity  No polydipsia No polyuria  No speech difficulty No syncope    The 10-year ASCVD risk score (Arnett DK, et al., 2019) is:  17.5%  ---------------------------------------------------------------------------------------------------     Medications: Outpatient Medications Prior to Visit  Medication Sig Note   aspirin 81 MG tablet Take 81 mg by mouth daily.     gabapentin (NEURONTIN) 100 MG capsule Take 1 capsule (100 mg total) by mouth 3 (three) times daily.    hydrocortisone-pramoxine (ANALPRAM HC) 2.5-1 % rectal cream Place 1 application rectally 3 (three) times daily. 06/07/2021: prn   lisinopril-hydrochlorothiazide (ZESTORETIC) 20-25 MG tablet TAKE ONE TABLET BY MOUTH ONCE DAILY    metoprolol succinate (TOPROL-XL) 50 MG 24 hr tablet TAKE ONE TABLET BY MOUTH ONCE DAILY TAKE WITH OR IMMEDIATELY FOLLOWING A MEAL    Multiple Vitamin (MULTIVITAMIN) capsule Take 1 capsule by mouth daily.    naproxen (NAPROSYN) 500 MG tablet TAKE ONE TABLET BY MOUTH TWICE DAILY AS NEEDED 06/07/2021: prn   omeprazole (PRILOSEC) 40 MG capsule TAKE ONE TABLET BY MOUTH ONCE DAILY    rosuvastatin (CRESTOR) 20 MG tablet Take 1 tablet (20 mg total) by mouth daily.    tadalafil (CIALIS) 5 MG tablet TAKE 1 TO 2 TABLETSP O DAILY    meclizine (ANTIVERT) 25 MG tablet Take 1 tablet (25 mg total) by mouth every 8 (eight) hours as needed (vertigo). (Patient not taking: Reported on 06/07/2021)    NEOMYCIN-POLYMYXIN-HYDROCORTISONE (CORTISPORIN) 1 % SOLN OTIC solution Apply 1-2 drops to toe BID after soaking (Patient not taking: Reported on 06/07/2021)    No facility-administered medications prior to visit.    Review of Systems     Objective    BP 136/70    Pulse 68    Temp 98.3 F (36.8 C) (Oral)  Ht 5\' 11"  (1.803 m)    Wt 239 lb 14.4 oz (108.8 kg)    SpO2 99%    BMI 33.46 kg/m    Physical Exam  General appearance: Mildly obese male, cooperative and in no acute distress Head: Normocephalic, without obvious abnormality, atraumatic Respiratory: Respirations even and unlabored, normal respiratory rate Extremities: All extremities are  intact.  Skin: Skin color, texture, turgor normal. No rashes seen  Psych: Appropriate mood and affect. Neurologic: Mental status: Alert, oriented to person, place, and time, thought content appropriate.     Assessment & Plan     1. Hyperlipidemia, unspecified hyperlipidemia type Tolerating increased dose of rosuvastatin without adverse effects.,  - Comprehensive metabolic panel - Lipid panel  2. Primary hypertension Well controlled, he has started back on regular exercise, walking 3-4 days a week.          The entirety of the information documented in the History of Present Illness, Review of Systems and Physical Exam were personally obtained by me. Portions of this information were initially documented by the CMA and reviewed by me for thoroughness and accuracy.     Lelon Huh, MD   [facility] [phone] (phone) [phone] (fax)  [place]

## 2021-06-08 LAB — LIPID PANEL
Chol/HDL Ratio: 4.2 ratio (ref 0.0–5.0)
Cholesterol, Total: 192 mg/dL (ref 100–199)
HDL: 46 mg/dL (ref >39)
LDL Chol Calc (NIH): 125 mg/dL — ABNORMAL HIGH (ref 0–99)
Triglycerides: 118 mg/dL (ref 0–149)
VLDL Cholesterol Cal: 21 mg/dL (ref 5–40)

## 2021-06-08 LAB — COMPREHENSIVE METABOLIC PANEL
ALT: 20 IU/L (ref 0–44)
AST: 18 IU/L (ref 0–40)
Albumin/Globulin Ratio: 2.1 (ref 1.2–2.2)
Albumin: 4.8 g/dL (ref 3.8–4.8)
Alkaline Phosphatase: 95 IU/L (ref 44–121)
BUN/Creatinine Ratio: 18 (ref 10–24)
BUN: 20 mg/dL (ref 8–27)
Bilirubin Total: 0.5 mg/dL (ref 0.0–1.2)
CO2: 23 mmol/L (ref 20–29)
Calcium: 9.6 mg/dL (ref 8.6–10.2)
Chloride: 101 mmol/L (ref 96–106)
Creatinine, Ser: 1.13 mg/dL (ref 0.76–1.27)
Globulin, Total: 2.3 g/dL (ref 1.5–4.5)
Glucose: 110 mg/dL — ABNORMAL HIGH (ref 70–99)
Potassium: 4.1 mmol/L (ref 3.5–5.2)
Sodium: 139 mmol/L (ref 134–144)
Total Protein: 7.1 g/dL (ref 6.0–8.5)
eGFR: 73 mL/min/{1.73_m2} (ref >59)

## 2021-06-10 ENCOUNTER — Telehealth: Payer: Self-pay

## 2021-06-10 NOTE — Telephone Encounter (Signed)
Copied from [place] [id number]. Topic: General - Inquiry >> Jun 10, 2021  3:34 PM Alanda Slim E wrote: Reason for CRM: Pt called to speak with nurse about his lab results / please advise

## 2021-06-10 NOTE — Telephone Encounter (Signed)
Patient did not get labs done at this office.

## 2021-06-11 ENCOUNTER — Other Ambulatory Visit: Payer: Self-pay | Admitting: Family Medicine

## 2021-06-11 DIAGNOSIS — E785 Hyperlipidemia, unspecified: Secondary | ICD-10-CM

## 2021-06-11 MED ORDER — ROSUVASTATIN CALCIUM 20 MG PO TABS
20.0000 mg | ORAL_TABLET | Freq: Every day | ORAL | 3 refills | Status: DC
Start: 2021-06-11 — End: 2021-07-02

## 2021-06-11 NOTE — Telephone Encounter (Signed)
Patient advised. Appointment scheduled.  

## 2021-06-11 NOTE — Telephone Encounter (Signed)
Cholesterol is better, down to 192. Continue current dose of rosuvastatin. Please schedule annual CPE in September 2023.

## 2021-07-02 ENCOUNTER — Other Ambulatory Visit: Payer: Self-pay | Admitting: Family Medicine

## 2021-07-02 DIAGNOSIS — K219 Gastro-esophageal reflux disease without esophagitis: Secondary | ICD-10-CM

## 2021-07-02 DIAGNOSIS — E785 Hyperlipidemia, unspecified: Secondary | ICD-10-CM

## 2021-07-02 DIAGNOSIS — I10 Essential (primary) hypertension: Secondary | ICD-10-CM

## 2021-07-02 MED ORDER — OMEPRAZOLE 40 MG PO CPDR: 40.0000 mg | DELAYED_RELEASE_CAPSULE | Freq: Every day | ORAL | 3 refills | Status: DC

## 2021-07-02 MED ORDER — ROSUVASTATIN CALCIUM 20 MG PO TABS: 20.0000 mg | ORAL_TABLET | Freq: Every day | ORAL | 4 refills | Status: DC

## 2021-07-02 MED ORDER — LISINOPRIL-HYDROCHLOROTHIAZIDE 20-25 MG PO TABS: 1.0000 | ORAL_TABLET | Freq: Every day | ORAL | 3 refills | Status: DC

## 2021-07-02 MED ORDER — METOPROLOL SUCCINATE ER 50 MG PO TB24: ORAL_TABLET | ORAL | 3 refills | Status: DC

## 2021-07-21 NOTE — Progress Notes (Signed)
07/22/2021 2:41 PM   Keith Villegas [birth year] [id number]  Referring provider: Birdie Sons, MD [address] [place],  [place] [address]  Chief Complaint  Patient presents with   Follow-up    1 year follow-up   Urological history 1. ED -contributing factors of age, HTN, HLD, obesity and DM -SHIM 21 -managed with tadalafil 5 mg daily  2. BPH with LU TS -PSA 0.3 02/2021 -I PSS 3/1   HPI: Keith Villegas is a 66 y.o. male who presents today for yearly follow up.    Patient still having spontaneous erections.  He denies any pain or curvature with erections.     SHIM     Row Name 07/22/21 1430         SHIM: Over the last 6 months:   How do you rate your confidence that you could get and keep an erection? Moderate     When you had erections with sexual stimulation, how often were your erections hard enough for penetration (entering your partner)? Most Times (much more than half the time)     During sexual intercourse, how often were you able to maintain your erection after you had penetrated (entered) your partner? Almost Always or Always     During sexual intercourse, how difficult was it to maintain your erection to completion of intercourse? Slightly Difficult     When you attempted sexual intercourse, how often was it satisfactory for you? Almost Always or Always       SHIM Total Score   SHIM 21              Score: 1-7 Severe ED 8-11 Moderate ED 12-16 Mild-Moderate ED 17-21 Mild ED 22-25 No ED     He has no urinary complaints at this time.  Patient denies any modifying or aggravating factors.  Patient denies any gross hematuria, dysuria or suprapubic/flank pain.  Patient denies any fevers, chills, nausea or vomiting.     IPSS     Row Name 07/22/21 1400         International Prostate Symptom Score   How often have you had the sensation of not emptying your bladder? Not at All     How often have you had to urinate less than every two  hours? About half the time     How often have you found you stopped and started again several times when you urinated? Not at All     How often have you had a weak urinary stream? Not at All     How often have you had to strain to start urination? Not at All     How many times did you typically get up at night to urinate? None     Total IPSS Score 3       Quality of Life due to urinary symptoms   If you were to spend the rest of your life with your urinary condition just the way it is now how would you feel about that? Pleased              Score:  1-7 Mild 8-19 Moderate 20-35 Severe   PMH: Past Medical History:  Diagnosis Date   Costochondritis 09/12/2015   GERD (gastroesophageal reflux disease)    Hypertension     Surgical History: Past Surgical History:  Procedure Laterality Date   APPENDECTOMY  1976   COLONOSCOPY WITH PROPOFOL N/A 11/20/2016   Procedure: COLONOSCOPY WITH PROPOFOL;  Surgeon:  Jonathon Bellows, MD;  Location: [facility] ENDOSCOPY;  Service: Endoscopy;  Laterality: N/A;   echocardiogram  10/02/2010   Myocardial perfusion scan  10/02/2010   normal perfusion. Normal systolic function. Borderline right ventricular dilation. EF>65%   TOE SURGERY  2005    Home Medications:  Allergies as of 07/22/2021   No Known Allergies      Medication List        Accurate as of July 22, 2021  2:41 PM. If you have any questions, ask your nurse or doctor.          STOP taking these medications    meclizine 25 MG tablet Commonly known as: ANTIVERT Stopped by: Shajuan Musso, PA-C   NEOMYCIN-POLYMYXIN-HYDROCORTISONE 1 % Soln OTIC solution Commonly known as: CORTISPORIN Stopped by: Tayia Stonesifer, PA-C       TAKE these medications    aspirin 81 MG tablet Take 81 mg by mouth daily.   gabapentin 100 MG capsule Commonly known as: NEURONTIN Take 1 capsule (100 mg total) by mouth 3 (three) times daily.   hydrocortisone-pramoxine 2.5-1 % rectal cream Commonly  known as: Analpram HC Place 1 application rectally 3 (three) times daily.   lisinopril-hydrochlorothiazide 20-25 MG tablet Commonly known as: ZESTORETIC Take 1 tablet by mouth daily.   metoprolol succinate 50 MG 24 hr tablet Commonly known as: TOPROL-XL Take with or immediately following a meal.   multivitamin capsule Take 1 capsule by mouth daily.   naproxen 500 MG tablet Commonly known as: NAPROSYN TAKE ONE TABLET BY MOUTH TWICE DAILY AS NEEDED   omeprazole 40 MG capsule Commonly known as: PRILOSEC Take 1 capsule (40 mg total) by mouth daily.   rosuvastatin 20 MG tablet Commonly known as: Crestor Take 1 tablet (20 mg total) by mouth daily.   tadalafil 5 MG tablet Commonly known as: CIALIS TAKE 1 TO 2 TABLETSP O DAILY        Allergies: No Known Allergies  Family History: Family History  Problem Relation Age of Onset   Cancer Mother        Dementia & Schizophrenia   CAD Father    Colon cancer Neg Hx    Prostate cancer Neg Hx     Social History:  reports that he has quit smoking. He has never used smokeless tobacco. He reports that he does not drink alcohol and does not use drugs.  ROS: Pertinent ROS in HPI  Physical Exam: BP (!) 152/82    Pulse 76    Ht 5\' 11"  (1.803 m)    Wt 243 lb (110.2 kg)    BMI 33.89 kg/m   Constitutional:  Well nourished. Alert and oriented, No acute distress. HEENT: [place] AT, mask in place.  Trachea midline Cardiovascular: No clubbing, cyanosis, or edema. Respiratory: Normal respiratory effort, no increased work of breathing. GU: No CVA tenderness.  No bladder fullness or masses.  Patient with uncircumcised phallus. Foreskin easily retracted  Urethral meatus is patent.  No penile discharge. No penile lesions or rashes. Scrotum without lesions, cysts, rashes and/or edema.  Testicles are located scrotally bilaterally. No masses are appreciated in the testicles. Left and right epididymis are normal. Rectal: Patient with  normal sphincter  tone. Anus and perineum without scarring or rashes. No rectal masses are appreciated. Prostate is approximately 50 + grams, could only palpate the apex and midportion of the gland, no nodules are appreciated. Seminal vesicles could not be palpated.   Neurologic: Grossly intact, no focal deficits, moving all 4 extremities. Psychiatric:  Normal mood and affect.   Laboratory Data: Lab Results  Component Value Date   WBC 10.6 03/05/2021   HGB 14.7 03/05/2021   HCT 41.3 03/05/2021   MCV 82 03/05/2021   PLT 215 03/05/2021    Lab Results  Component Value Date   CREATININE 1.13 06/07/2021    Lab Results  Component Value Date   HGBA1C 6.3 (H) 03/05/2021       Component Value Date/Time   CHOL 192 06/07/2021 0930   HDL 46 06/07/2021 0930   CHOLHDL 4.2 06/07/2021 0930   LDLCALC 125 (H) 06/07/2021 0930    Lab Results  Component Value Date   AST 18 06/07/2021   Lab Results  Component Value Date   ALT 20 06/07/2021   Component     Latest Ref Rng & Units 03/05/2021  Prostate Specific Ag, Serum     0.0 - 4.0 ng/mL 0.3  Reflex Criteria      Comment  I have reviewed the labs.  Pertinent Imaging N/A   Assessment & Plan:    1. Erectile dysfunction -Doing well with tadalafil 5 mg daily -Refill sent to pharmacy  2. BPH with LUTS -PSA stable -DRE benign -continue conservative management, avoiding bladder irritants and timed voiding's    Return in about 1 year (around 07/22/2022) for IPSS, SHIM and exam.  These notes generated with voice recognition software. I apologize for typographical errors.  Zara Council, PA-C  [facility] [address]  [place], [place] [address] [phone]

## 2021-07-22 ENCOUNTER — Other Ambulatory Visit: Payer: Self-pay

## 2021-07-22 ENCOUNTER — Encounter: Payer: Self-pay | Admitting: Urology

## 2021-07-22 ENCOUNTER — Ambulatory Visit: Payer: BC Managed Care – PPO | Admitting: Urology

## 2021-07-22 VITALS — BP 152/82 | HR 76 | Ht 71.0 in | Wt 243.0 lb

## 2021-07-22 DIAGNOSIS — N529 Male erectile dysfunction, unspecified: Secondary | ICD-10-CM | POA: Diagnosis not present

## 2021-07-22 DIAGNOSIS — N401 Enlarged prostate with lower urinary tract symptoms: Secondary | ICD-10-CM | POA: Diagnosis not present

## 2021-07-22 DIAGNOSIS — N138 Other obstructive and reflux uropathy: Secondary | ICD-10-CM | POA: Diagnosis not present

## 2021-07-22 MED ORDER — TADALAFIL 5 MG PO TABS: ORAL_TABLET | ORAL | 6 refills | Status: DC

## 2022-03-07 ENCOUNTER — Encounter: Payer: Self-pay | Admitting: Family Medicine

## 2022-03-07 ENCOUNTER — Ambulatory Visit (INDEPENDENT_AMBULATORY_CARE_PROVIDER_SITE_OTHER): Payer: BC Managed Care – PPO | Admitting: Family Medicine

## 2022-03-07 VITALS — BP 125/69 | HR 63 | Temp 98.0°F | Resp 16 | Ht 71.0 in | Wt 241.0 lb

## 2022-03-07 DIAGNOSIS — K219 Gastro-esophageal reflux disease without esophagitis: Secondary | ICD-10-CM | POA: Diagnosis not present

## 2022-03-07 DIAGNOSIS — I10 Essential (primary) hypertension: Secondary | ICD-10-CM | POA: Diagnosis not present

## 2022-03-07 DIAGNOSIS — L219 Seborrheic dermatitis, unspecified: Secondary | ICD-10-CM

## 2022-03-07 DIAGNOSIS — Z860101 Personal history of adenomatous and serrated colon polyps: Secondary | ICD-10-CM

## 2022-03-07 DIAGNOSIS — R7303 Prediabetes: Secondary | ICD-10-CM

## 2022-03-07 DIAGNOSIS — E785 Hyperlipidemia, unspecified: Secondary | ICD-10-CM | POA: Diagnosis not present

## 2022-03-07 DIAGNOSIS — Z125 Encounter for screening for malignant neoplasm of prostate: Secondary | ICD-10-CM

## 2022-03-07 DIAGNOSIS — Z0000 Encounter for general adult medical examination without abnormal findings: Secondary | ICD-10-CM | POA: Diagnosis not present

## 2022-03-07 DIAGNOSIS — Z86010 Personal history of colonic polyps: Secondary | ICD-10-CM

## 2022-03-07 DIAGNOSIS — G8929 Other chronic pain: Secondary | ICD-10-CM

## 2022-03-07 DIAGNOSIS — M545 Low back pain, unspecified: Secondary | ICD-10-CM

## 2022-03-07 MED ORDER — NEOMYCIN-POLYMYXIN-HC 3.5-10000-1 OT SOLN: OTIC | 1 refills | Status: DC

## 2022-03-07 NOTE — Progress Notes (Unsigned)
Complete physical exam   Patient: Keith Villegas   DOB: 12/31/1955   66 y.o. Male  MRN: [id number] Visit Date: 03/07/2022  Today's healthcare provider: Lelon Huh, MD   Chief Complaint  Patient presents with   Annual Exam   Subjective    Keith Villegas is a 66 y.o. male who presents today for a complete physical exam.  He reports consuming a general diet. Home exercise routine includes walking 2.5 hrs per week. He generally feels well. He reports sleeping well. He does not have additional problems to discuss today.  He is doing well with current medications for hyperlipidemia, hypertension and reflux with no adverse effects.   He does reports that ears are itchy and has had trouble with wax build up in the past. Has seen Dr. Ladene Artist in the past.    Past Medical History:  Diagnosis Date   Costochondritis 09/12/2015   GERD (gastroesophageal reflux disease)    Hypertension    Past Surgical History:  Procedure Laterality Date   APPENDECTOMY  1976   COLONOSCOPY WITH PROPOFOL N/A 11/20/2016   Procedure: COLONOSCOPY WITH PROPOFOL;  Surgeon: Jonathon Bellows, MD;  Location[facility] ENDOSCOPY;  Service: Endoscopy;  Laterality: N/A;   echocardiogram  10/02/2010   Myocardial perfusion scan  10/02/2010   normal perfusion. Normal systolic function. Borderline right ventricular dilation. EF>65%   TOE SURGERY  2005   Social History   Socioeconomic History   Marital status: Married    Spouse name: Not on file   Number of children: 2   Years of education: Not on file   Highest education level: Not on file  Occupational History   Occupation: Visual merchandiser    Comment: works at [employer] Use   Smoking status: Former   Smokeless tobacco: Never  Scientific laboratory technician Use: Never used  Substance and Sexual Activity   Alcohol use: No    Alcohol/week: 0.0 standard drinks of alcohol   Drug use: No   Sexual activity: Not on file  Other Topics Concern   Not on file   Social History Narrative   Not on file   Social Determinants of Health   Financial Resource Strain: Not on file  Food Insecurity: Not on file  Transportation Needs: Not on file  Physical Activity: Not on file  Stress: Not on file  Social Connections: Not on file  Intimate Partner Violence: Not on file   Family Status  Relation Name Status   Mother  Deceased       Cause of Death: COVID-96   Father  Deceased       2   Sister  Alive   Brother  Alive   Brother  Alive   Neg Hx  (Not Specified)   Family History  Problem Relation Age of Onset   Cancer Mother        Dementia & Schizophrenia   CAD Father    Colon cancer Neg Hx    Prostate cancer Neg Hx    No Known Allergies  Patient Care Team: Birdie Sons, MD as PCP - General (Family Medicine) [place], Romilda Garret, DPM as Consulting Physician (Podiatry) System, Provider Not In [place], Dorathy Daft, DPM as Consulting Physician (Podiatry) Johnnette Gourd, MD (Physical Medicine and Rehabilitation) Vladimir Crofts, MD as Consulting Physician (Neurology)   Medications: Outpatient Medications Prior to Visit  Medication Sig   aspirin 81 MG tablet Take 81 mg by mouth  daily.    gabapentin (NEURONTIN) 100 MG capsule Take 1 capsule (100 mg total) by mouth 3 (three) times daily.   hydrocortisone-pramoxine (ANALPRAM HC) 2.5-1 % rectal cream Place 1 application rectally 3 (three) times daily.   lisinopril-hydrochlorothiazide (ZESTORETIC) 20-25 MG tablet Take 1 tablet by mouth daily.   metoprolol succinate (TOPROL-XL) 50 MG 24 hr tablet Take with or immediately following a meal.   Multiple Vitamin (MULTIVITAMIN) capsule Take 1 capsule by mouth daily.   omeprazole (PRILOSEC) 40 MG capsule Take 1 capsule (40 mg total) by mouth daily.   rosuvastatin (CRESTOR) 20 MG tablet Take 1 tablet (20 mg total) by mouth daily.   tadalafil (CIALIS) 5 MG tablet TAKE 1 TO 2 TABLETSP O DAILY   [DISCONTINUED] naproxen (NAPROSYN) 500 MG tablet TAKE ONE  TABLET BY MOUTH TWICE DAILY AS NEEDED   No facility-administered medications prior to visit.    Review of Systems  Constitutional:  Negative for chills, diaphoresis and fever.  HENT:  Negative for congestion, ear discharge, ear pain, hearing loss, nosebleeds, sore throat and tinnitus.   Eyes:  Negative for photophobia, pain, discharge and redness.  Respiratory:  Negative for cough, shortness of breath, wheezing and stridor.   Cardiovascular:  Negative for chest pain, palpitations and leg swelling.  Gastrointestinal:  Negative for abdominal pain, blood in stool, constipation, diarrhea, nausea and vomiting.  Endocrine: Negative for polydipsia.  Genitourinary:  Negative for dysuria, flank pain, frequency, hematuria and urgency.  Musculoskeletal:  Positive for back pain. Negative for myalgias and neck pain.  Skin:  Negative for rash.  Allergic/Immunologic: Negative for environmental allergies.  Neurological:  Negative for dizziness, tremors, seizures, weakness and headaches.  Hematological:  Does not bruise/bleed easily.  Psychiatric/Behavioral:  Negative for hallucinations and suicidal ideas. The patient is not nervous/anxious.   All other systems reviewed and are negative.   Last CBC Lab Results  Component Value Date   WBC 10.6 03/05/2021   HGB 14.7 03/05/2021   HCT 41.3 03/05/2021   MCV 82 03/05/2021   MCH 29.0 03/05/2021   RDW 13.2 03/05/2021   PLT 215 58/52/7782   Last metabolic panel Lab Results  Component Value Date   GLUCOSE 110 (H) 06/07/2021   NA 139 06/07/2021   K 4.1 06/07/2021   CL 101 06/07/2021   CO2 23 06/07/2021   BUN 20 06/07/2021   CREATININE 1.13 06/07/2021   EGFR 73 06/07/2021   CALCIUM 9.6 06/07/2021   PROT 7.1 06/07/2021   ALBUMIN 4.8 06/07/2021   LABGLOB 2.3 06/07/2021   AGRATIO 2.1 06/07/2021   BILITOT 0.5 06/07/2021   ALKPHOS 95 06/07/2021   AST 18 06/07/2021   ALT 20 06/07/2021   ANIONGAP 3 (L) 07/26/2012   Last lipids Lab Results   Component Value Date   CHOL 192 06/07/2021   HDL 46 06/07/2021   LDLCALC 125 (H) 06/07/2021   TRIG 118 06/07/2021   CHOLHDL 4.2 06/07/2021   Last hemoglobin A1c Lab Results  Component Value Date   HGBA1C 6.3 (H) 03/05/2021   Last thyroid functions Lab Results  Component Value Date   TSH 1.570 04/09/2020      Objective     BP 125/69 (BP Location: Left Arm, Patient Position: Sitting, Cuff Size: Large)   Pulse 63   Temp 98 F (36.7 C) (Oral)   Resp 16   Ht 5' 11"  (1.803 m)   Wt 241 lb (109.3 kg)   BMI 33.61 kg/m  BP Readings from Last 3 Encounters:  03/07/22 125/69  07/22/21 (!) 152/82  06/07/21 136/70   Wt Readings from Last 3 Encounters:  03/07/22 241 lb (109.3 kg)  07/22/21 243 lb (110.2 kg)  06/07/21 239 lb 14.4 oz (108.8 kg)    Physical Exam    General Appearance:    Obese male. Alert, cooperative, in no acute distress, appears stated age  Head:    Normocephalic, without obvious abnormality, atraumatic  Eyes:    PERRL, conjunctiva/corneas clear, EOM's intact, fundi    benign, both eyes       Ears:    Normal TM on right, seborhhea in both ear canals. Curved ear canals with excessive wax in left, unable to visualize left TM  Nose:   Nares normal, septum midline, mucosa normal, no drainage   or sinus tenderness  Throat:   Lips, mucosa, and tongue normal; teeth and gums normal  Neck:   Supple, symmetrical, trachea midline, no adenopathy;       thyroid:  No enlargement/tenderness/nodules; no carotid   bruit or JVD  Back:     Symmetric, no curvature, ROM normal, no CVA tenderness  Lungs:     Clear to auscultation bilaterally, respirations unlabored  Chest wall:    No tenderness or deformity  Heart:    Normal heart rate. Normal rhythm. No murmurs, rubs, or gallops.  S1 and S2 normal  Abdomen:     Soft, non-tender, bowel sounds active all four quadrants,    no masses, no organomegaly  Genitalia:    deferred  Rectal:    deferred  Extremities:   All  extremities are intact. No cyanosis or edema  Pulses:   2+ and symmetric all extremities  Skin:   Skin color, texture, turgor normal, no rashes or lesions  Lymph nodes:   Cervical, supraclavicular, and axillary nodes normal  Neurologic:   CNII-XII intact. Normal strength, sensation and reflexes      throughout     Last depression screening scores    03/07/2022    9:14 AM 03/05/2021    8:55 AM 04/09/2020    8:41 AM  PHQ 2/9 Scores  PHQ - 2 Score 0 0 0  PHQ- 9 Score 0 0    Last fall risk screening    03/07/2022    9:13 AM  [place] in the past year? 0  Number falls in past yr: 0  Injury with Fall? 0  Risk for fall due to : No Fall Risks  Follow up Falls evaluation completed   Last Audit-C alcohol use screening    03/07/2022    9:14 AM  Alcohol Use Disorder Test (AUDIT)  1. How often do you have a drink containing alcohol? 0  2. How many drinks containing alcohol do you have on a typical day when you are drinking? 0  3. How often do you have six or more drinks on one occasion? 0  AUDIT-C Score 0   A score of 3 or more in women, and 4 or more in men indicates increased risk for alcohol abuse, EXCEPT if all of the points are from question 1   No results found for any visits on 03/07/22.  Assessment & Plan    Routine Health Maintenance and Physical Exam  Exercise Activities and Dietary recommendations  Goals   None     Immunization History  Administered Date(s) Administered   Influenza Split 06/07/2011   Influenza,inj,Quad PF,6-35 Mos 04/06/2020   Influenza-Unspecified 04/10/2017, 04/07/2018   Janssen (J&J) SARS-COV-2  Vaccination 08/29/2019, 04/18/2020   Moderna Sars-Covid-2 Vaccination 03/04/2021   Td 10/30/2017   Tdap 09/21/2007    Health Maintenance  Topic Date Due   Zoster Vaccines- Shingrix (1 of 2) Never done   COVID-19 Vaccine (4 - Booster for Janssen series) 04/29/2021   Pneumonia Vaccine 80+ Years old (1 - PCV) Never done   COLONOSCOPY (Pts  45-43yr Insurance coverage will need to be confirmed)  11/20/2021   INFLUENZA VACCINE  01/21/2022   TETANUS/TDAP  10/31/2027   Hepatitis C Screening  Completed   HIV Screening  Completed   HPV VACCINES  Aged Out    Discussed health benefits of physical activity, and encouraged him to engage in regular exercise appropriate for his age and condition.  ***  No follow-ups on file.     {provider attestation***:1}   DLelon Huh MD  [phone](phone) [phone](fax)  [place]

## 2022-03-07 NOTE — Patient Instructions (Signed)
Please review the attached list of medications and notify my office if there are any errors.   I recommend that you get the Prevnar 20 vaccine to protect yourself from certain dangerous strains of pneumonia. You can get Prevnar 20 at your pharmacy, or call our office at [phone] at your earliest convenience to schedule this vaccine.   The CDC recommends two doses of Shingrix (the shingles vaccine) separated by 2 to 6 months for adults age 65 years and older. I recommend checking with your insurance plan regarding coverage for this vaccine.    

## 2022-03-08 LAB — CBC
Hematocrit: 42.8 % (ref 37.5–51.0)
Hemoglobin: 14 g/dL (ref 13.0–17.7)
MCH: 28.3 pg (ref 26.6–33.0)
MCHC: 32.7 g/dL (ref 31.5–35.7)
MCV: 87 fL (ref 79–97)
Platelets: 229 10*3/uL (ref 150–450)
RBC: 4.95 x10E6/uL (ref 4.14–5.80)
RDW: 13.2 % (ref 11.6–15.4)
WBC: 9.7 10*3/uL (ref 3.4–10.8)

## 2022-03-08 LAB — LIPID PANEL
Chol/HDL Ratio: 3.9 ratio (ref 0.0–5.0)
Cholesterol, Total: 200 mg/dL — ABNORMAL HIGH (ref 100–199)
HDL: 51 mg/dL (ref >39)
LDL Chol Calc (NIH): 120 mg/dL — ABNORMAL HIGH (ref 0–99)
Triglycerides: 164 mg/dL — ABNORMAL HIGH (ref 0–149)
VLDL Cholesterol Cal: 29 mg/dL (ref 5–40)

## 2022-03-08 LAB — COMPREHENSIVE METABOLIC PANEL
ALT: 14 IU/L (ref 0–44)
AST: 16 IU/L (ref 0–40)
Albumin/Globulin Ratio: 2.1 (ref 1.2–2.2)
Albumin: 4.7 g/dL (ref 3.9–4.9)
Alkaline Phosphatase: 93 IU/L (ref 44–121)
BUN/Creatinine Ratio: 16 (ref 10–24)
BUN: 17 mg/dL (ref 8–27)
Bilirubin Total: 0.5 mg/dL (ref 0.0–1.2)
CO2: 27 mmol/L (ref 20–29)
Calcium: 9.9 mg/dL (ref 8.6–10.2)
Chloride: 99 mmol/L (ref 96–106)
Creatinine, Ser: 1.09 mg/dL (ref 0.76–1.27)
Globulin, Total: 2.2 g/dL (ref 1.5–4.5)
Glucose: 107 mg/dL — ABNORMAL HIGH (ref 70–99)
Potassium: 4.1 mmol/L (ref 3.5–5.2)
Sodium: 139 mmol/L (ref 134–144)
Total Protein: 6.9 g/dL (ref 6.0–8.5)
eGFR: 75 mL/min/{1.73_m2} (ref >59)

## 2022-03-08 LAB — TSH: TSH: 0.828 u[IU]/mL (ref 0.450–4.500)

## 2022-03-08 LAB — PSA TOTAL (REFLEX TO FREE): Prostate Specific Ag, Serum: 0.2 ng/mL (ref 0.0–4.0)

## 2022-03-08 LAB — HEMOGLOBIN A1C
Est. average glucose Bld gHb Est-mCnc: 126 mg/dL
Hgb A1c MFr Bld: 6 % — ABNORMAL HIGH (ref 4.8–5.6)

## 2022-03-08 LAB — MAGNESIUM: Magnesium: 1.9 mg/dL (ref 1.6–2.3)

## 2022-03-10 ENCOUNTER — Telehealth: Payer: Self-pay

## 2022-03-10 NOTE — Telephone Encounter (Signed)
Tried returning call. See lab note.

## 2022-03-10 NOTE — Telephone Encounter (Signed)
Copied from [place] [id number]. Topic: General - Other >> Mar 10, 2022 10:14 AM Chapman Fitch wrote: Reason for CRM: Pt would like a call to go over lab results / please advise

## 2022-03-13 ENCOUNTER — Telehealth: Payer: Self-pay

## 2022-03-13 ENCOUNTER — Other Ambulatory Visit: Payer: Self-pay

## 2022-03-13 DIAGNOSIS — Z86010 Personal history of colonic polyps: Secondary | ICD-10-CM

## 2022-03-13 MED ORDER — NA SULFATE-K SULFATE-MG SULF 17.5-3.13-1.6 GM/177ML PO SOLN: 1.0000 | Freq: Once | ORAL | 0 refills | Status: AC

## 2022-03-13 NOTE — Telephone Encounter (Signed)
Gastroenterology Pre-Procedure Review  Request Date: 04/08/22 Requesting Physician: Dr. Vicente Males  PATIENT REVIEW QUESTIONS: The patient responded to the following health history questions as indicated:    1. Are you having any GI issues? no 2. Do you have a personal history of Polyps? yes (last colonoscopy performed by Dr. Vicente Males 11/20/2016) 3. Do you have a family history of Colon Cancer or Polyps? no 4. Diabetes Mellitus? no 5. Joint replacements in the past 12 months?no 6. Major health problems in the past 3 months?no 7. Any artificial heart valves, MVP, or defibrillator?no    MEDICATIONS & ALLERGIES:    Patient reports the following regarding taking any anticoagulation/antiplatelet therapy:   Plavix, Coumadin, Eliquis, Xarelto, Lovenox, Pradaxa, Brilinta, or Effient? no Aspirin? yes ('81mg'$  daily)  Patient confirms/reports the following medications:  Current Outpatient Medications  Medication Sig Dispense Refill   aspirin 81 MG tablet Take 81 mg by mouth daily.      gabapentin (NEURONTIN) 100 MG capsule Take 1 capsule (100 mg total) by mouth 3 (three) times daily. 90 capsule 1   hydrocortisone-pramoxine (ANALPRAM HC) 2.5-1 % rectal cream Place 1 application rectally 3 (three) times daily. (Patient not taking: Reported on 03/13/2022) 30 g 1   lisinopril-hydrochlorothiazide (ZESTORETIC) 20-25 MG tablet Take 1 tablet by mouth daily. 90 tablet 3   metoprolol succinate (TOPROL-XL) 50 MG 24 hr tablet Take with or immediately following a meal. 90 tablet 3   Multiple Vitamin (MULTIVITAMIN) capsule Take 1 capsule by mouth daily.     neomycin-polymyxin-hydrocortisone (CORTISPORIN) OTIC solution 3-4 drops both ears as neeeded 10 mL 1   omeprazole (PRILOSEC) 40 MG capsule Take 1 capsule (40 mg total) by mouth daily. 90 capsule 3   rosuvastatin (CRESTOR) 20 MG tablet Take 1 tablet (20 mg total) by mouth daily. 90 tablet 4   tadalafil (CIALIS) 5 MG tablet TAKE 1 TO 2 TABLETSP O DAILY 30 tablet 6   No  current facility-administered medications for this visit.    Patient confirms/reports the following allergies:  No Known Allergies  No orders of the defined types were placed in this encounter.   AUTHORIZATION INFORMATION Primary Insurance: 1D#: Group #:  Secondary Insurance: 1D#: Group #:  SCHEDULE INFORMATION: Date: 04/08/22 Time: Location: [facility]

## 2022-04-02 ENCOUNTER — Other Ambulatory Visit: Payer: Self-pay | Admitting: Urology

## 2022-04-02 DIAGNOSIS — N529 Male erectile dysfunction, unspecified: Secondary | ICD-10-CM

## 2022-04-08 ENCOUNTER — Ambulatory Visit: Payer: BC Managed Care – PPO | Admitting: Anesthesiology

## 2022-04-08 ENCOUNTER — Encounter: Payer: Self-pay | Admitting: Gastroenterology

## 2022-04-08 ENCOUNTER — Encounter
Admission: RE | Disposition: A | Discharge status: Home / Self Care | Payer: Self-pay | Source: Ambulatory Visit | Attending: Gastroenterology

## 2022-04-08 ENCOUNTER — Ambulatory Visit
Admission: RE | Admit: 2022-04-08 | Discharge: 2022-04-08 | Disposition: A | Discharge status: Home / Self Care | Payer: BC Managed Care – PPO | Source: Ambulatory Visit | Attending: Gastroenterology | Admitting: Gastroenterology

## 2022-04-08 DIAGNOSIS — Z87891 Personal history of nicotine dependence: Secondary | ICD-10-CM | POA: Diagnosis not present

## 2022-04-08 DIAGNOSIS — Z1211 Encounter for screening for malignant neoplasm of colon: Secondary | ICD-10-CM | POA: Insufficient documentation

## 2022-04-08 DIAGNOSIS — N400 Enlarged prostate without lower urinary tract symptoms: Secondary | ICD-10-CM | POA: Diagnosis not present

## 2022-04-08 DIAGNOSIS — Z86010 Personal history of colonic polyps: Secondary | ICD-10-CM

## 2022-04-08 DIAGNOSIS — Z6832 Body mass index (BMI) 32.0-32.9, adult: Secondary | ICD-10-CM | POA: Diagnosis not present

## 2022-04-08 DIAGNOSIS — D126 Benign neoplasm of colon, unspecified: Secondary | ICD-10-CM

## 2022-04-08 DIAGNOSIS — E669 Obesity, unspecified: Secondary | ICD-10-CM | POA: Diagnosis not present

## 2022-04-08 DIAGNOSIS — I10 Essential (primary) hypertension: Secondary | ICD-10-CM | POA: Insufficient documentation

## 2022-04-08 DIAGNOSIS — K219 Gastro-esophageal reflux disease without esophagitis: Secondary | ICD-10-CM | POA: Insufficient documentation

## 2022-04-08 HISTORY — PX: COLONOSCOPY WITH PROPOFOL: SHX5780

## 2022-04-08 SURGERY — COLONOSCOPY WITH PROPOFOL
Anesthesia: General

## 2022-04-08 MED ORDER — LIDOCAINE HCL (CARDIAC) PF 100 MG/5ML IV SOSY
PREFILLED_SYRINGE | INTRAVENOUS | Status: DC | PRN
  Administered 2022-04-08: 50 mg via INTRAVENOUS

## 2022-04-08 MED ORDER — SODIUM CHLORIDE 0.9 % IV SOLN: INTRAVENOUS | Status: DC

## 2022-04-08 MED ORDER — PROPOFOL 500 MG/50ML IV EMUL
INTRAVENOUS | Status: DC | PRN
  Administered 2022-04-08: 125 ug/kg/min via INTRAVENOUS

## 2022-04-08 MED ORDER — PROPOFOL 1000 MG/100ML IV EMUL
INTRAVENOUS | Status: AC
  Filled 2022-04-08: qty 100

## 2022-04-08 MED ORDER — PROPOFOL 10 MG/ML IV BOLUS
INTRAVENOUS | Status: DC | PRN
  Administered 2022-04-08: 110 mg via INTRAVENOUS

## 2022-04-08 NOTE — Anesthesia Preprocedure Evaluation (Addendum)
Anesthesia Evaluation  Patient identified by MRN, date of birth, ID band Patient awake    Reviewed: Allergy & Precautions, NPO status , Patient's Chart, lab work & pertinent test results  History of Anesthesia Complications Negative for: history of anesthetic complications  Airway Mallampati: IV   Neck ROM: Full    Dental  (+) Missing   Pulmonary former smoker (quit 30 years ago),    Pulmonary exam normal breath sounds clear to auscultation       Cardiovascular hypertension, Normal cardiovascular exam Rhythm:Regular Rate:Normal  ECG 03/07/22:  Sinus bradycardia with 1st degree AV block  Otherwise normal ECG  Echo 04/21/20:  1. The left ventricle is normal in size with upper normal wall thickness.  2. The left ventricular systolic function is normal, LVEF is visually estimated at > 55%.  3. Mitral annular calcification is present (mild).  4. The mitral valve leaflets are mildly thickened with normal leaflet mobility.  5. The aortic valve is trileaflet with mildly thickened leaflets with mildly reduced excursion.  6. The right ventricle is normal in size, with normal systolic function.  7. Technically difficult study; an ultrasound enhancing agent was utilized to improve endocardial border definition.     Neuro/Psych Chronic LBP    GI/Hepatic GERD  ,  Endo/Other  Obesity  Renal/GU negative Renal ROS   BPH    Musculoskeletal   Abdominal   Peds  Hematology negative hematology ROS (+)   Anesthesia Other Findings   Reproductive/Obstetrics                            Anesthesia Physical Anesthesia Plan  ASA: 2  Anesthesia Plan: General   Post-op Pain Management:    Induction: Intravenous  PONV Risk Score and Plan: 2 and Propofol infusion, TIVA and Treatment may vary due to age or medical condition  Airway Management Planned: Natural Airway  Additional Equipment:    Intra-op Plan:   Post-operative Plan:   Informed Consent: I have reviewed the patients History and Physical, chart, labs and discussed the procedure including the risks, benefits and alternatives for the proposed anesthesia with the patient or authorized representative who has indicated his/her understanding and acceptance.       Plan Discussed with: CRNA  Anesthesia Plan Comments: (LMA/GETA backup discussed.  Patient consented for risks of anesthesia including but not limited to:  - adverse reactions to medications - damage to eyes, teeth, lips or other oral mucosa - nerve damage due to positioning  - sore throat or hoarseness - damage to heart, brain, nerves, lungs, other parts of body or loss of life  Informed patient about role of CRNA in peri- and intra-operative care.  Patient voiced understanding.)        Anesthesia Quick Evaluation

## 2022-04-08 NOTE — Anesthesia Postprocedure Evaluation (Signed)
Anesthesia Post Note  Patient: SILUS LANZO  Procedure(s) Performed: COLONOSCOPY WITH PROPOFOL  Patient location during evaluation: PACU Anesthesia Type: General Level of consciousness: awake and alert, oriented and patient cooperative Pain management: pain level controlled Vital Signs Assessment: post-procedure vital signs reviewed and stable Respiratory status: spontaneous breathing, nonlabored ventilation and respiratory function stable Cardiovascular status: blood pressure returned to baseline and stable Postop Assessment: adequate PO intake Anesthetic complications: no   No notable events documented.   Last Vitals:  Vitals:   04/08/22 1137 04/08/22 1147  BP: (!) 149/64 (!) 165/77  Pulse: 62 (!) 59  Resp: 17   Temp:    SpO2: 100% 100%    Last Pain:  Vitals:   04/08/22 1137  TempSrc:   PainSc: 0-No pain                 Darrin Nipper

## 2022-04-08 NOTE — H&P (Signed)
Keith Bellows, MD [address] [place], [place], [place], [address] [address], [place], [place], [place], [address] Phone: [phone]  Fax: [phone]  Primary Care Physician:  Birdie Sons, MD   Pre-Procedure History & Physical: HPI:  Keith Villegas is a 66 y.o. male is here for an colonoscopy.   Past Medical History:  Diagnosis Date   Costochondritis 09/12/2015   GERD (gastroesophageal reflux disease)    Hypertension     Past Surgical History:  Procedure Laterality Date   APPENDECTOMY  1976   COLONOSCOPY WITH PROPOFOL N/A 11/20/2016   Procedure: COLONOSCOPY WITH PROPOFOL;  Surgeon: Keith Bellows, MD;  Location: [facility] ENDOSCOPY;  Service: Endoscopy;  Laterality: N/A;   echocardiogram  10/02/2010   Myocardial perfusion scan  10/02/2010   normal perfusion. Normal systolic function. Borderline right ventricular dilation. EF>65%   TOE SURGERY  2005    Prior to Admission medications   Medication Sig Start Date End Date Taking? Authorizing Provider  gabapentin (NEURONTIN) 100 MG capsule Take 1 capsule (100 mg total) by mouth 3 (three) times daily. 04/25/20  Yes Flinchum, Kelby Aline, FNP  lisinopril-hydrochlorothiazide (ZESTORETIC) 20-25 MG tablet Take 1 tablet by mouth daily. 07/02/21  Yes Birdie Sons, MD  metoprolol succinate (TOPROL-XL) 50 MG 24 hr tablet Take with or immediately following a meal. 07/02/21  Yes Birdie Sons, MD  omeprazole (PRILOSEC) 40 MG capsule Take 1 capsule (40 mg total) by mouth daily. 07/02/21  Yes Birdie Sons, MD  rosuvastatin (CRESTOR) 20 MG tablet Take 1 tablet (20 mg total) by mouth daily. 07/02/21  Yes Birdie Sons, MD  aspirin 81 MG tablet Take 81 mg by mouth daily.  03/27/08   [provider]  hydrocortisone-pramoxine (ANALPRAM HC) 2.5-1 % rectal cream Place 1 application rectally 3 (three) times daily. Patient not taking: Reported on 03/13/2022 10/30/17   Birdie Sons, MD  Multiple Vitamin (MULTIVITAMIN) capsule Take  1 capsule by mouth daily.    [provider]  neomycin-polymyxin-hydrocortisone (CORTISPORIN) OTIC solution 3-4 drops both ears as neeeded 03/07/22   Birdie Sons, MD  tadalafil (CIALIS) 5 MG tablet TAKE ONE TO TWO TABLETS BY MOUTH EVERY DAY 04/03/22   Zara Council A, PA-C    Allergies as of 03/13/2022   (No Known Allergies)    Family History  Problem Relation Age of Onset   Cancer Mother        Dementia & Schizophrenia   CAD Father    Colon cancer Neg Hx    Prostate cancer Neg Hx     Social History   Socioeconomic History   Marital status: Married    Spouse name: Not on file   Number of children: 2   Years of education: Not on file   Highest education level: Not on file  Occupational History   Occupation: Visual merchandiser    Comment: works at [employer] Use   Smoking status: Former   Smokeless tobacco: Never  Scientific laboratory technician Use: Never used  Substance and Sexual Activity   Alcohol use: No    Alcohol/week: 0.0 standard drinks of alcohol   Drug use: No   Sexual activity: Not on file  Other Topics Concern   Not on file  Social History Narrative   Not on file   Social Determinants of Health   Financial Resource Strain: Not on file  Food Insecurity: Not on file  Transportation Needs: Not on file  Physical Activity: Not on file  Stress: Not on file  Social Connections: Not on file  Intimate Partner Violence: Not on file    Review of Systems: See HPI, otherwise negative ROS  Physical Exam: BP (!) 155/73   Pulse 71   Temp (!) 97.3 F (36.3 C) (Temporal)   Resp 16   Ht '5\' 11"'$  (1.803 m)   Wt 106.6 kg   SpO2 99%   BMI 32.78 kg/m  General:   Alert,  pleasant and cooperative in NAD Head:  Normocephalic and atraumatic. Neck:  Supple; no masses or thyromegaly. Lungs:  Clear throughout to auscultation, normal respiratory effort.    Heart:  +S1, +S2, Regular rate and rhythm, No edema. Abdomen:  Soft, nontender and  nondistended. Normal bowel sounds, without guarding, and without rebound.   Neurologic:  Alert and  oriented x4;  grossly normal neurologically.  Impression/Plan: Keith Villegas is here for an colonoscopy to be performed for surveillance due to prior history of colon polyps   Risks, benefits, limitations, and alternatives regarding  colonoscopy have been reviewed with the patient.  Questions have been answered.  All parties agreeable.   Keith Bellows, MD  04/08/2022, 11:07 AM

## 2022-04-08 NOTE — Transfer of Care (Signed)
Immediate Anesthesia Transfer of Care Note  Patient: Keith Villegas  Procedure(s) Performed: COLONOSCOPY WITH PROPOFOL  Patient Location: PACU  Anesthesia Type:MAC  Level of Consciousness: drowsy  Airway & Oxygen Therapy: Patient Spontanous Breathing and Patient connected to face mask oxygen  Post-op Assessment: Report given to RN and Post -op Vital signs reviewed and stable  Post vital signs: stable  Last Vitals:  Vitals Value Taken Time  BP 116/65 04/08/22 1127  Temp    Pulse 77 04/08/22 1128  Resp 23 04/08/22 1128  SpO2 97 % 04/08/22 1128  Vitals shown include unvalidated device data.  Last Pain:  Vitals:   04/08/22 0947  TempSrc: Temporal         Complications: No notable events documented.

## 2022-04-08 NOTE — Op Note (Signed)
[facility] Gastroenterology Patient Name: Keith Villegas Procedure Date: 04/08/2022 11:07 AM MRN: [id number] Account #: [id number] Date of Birth: [birth year] Admit Type: Outpatient Age: 66 Room: [facility] ENDO ROOM 1 Gender: Male Note Status: Finalized Instrument Name: Park Meo [id number] Procedure:             Colonoscopy Indications:           Surveillance: Personal history of colonic polyps                         (unknown histology) on last colonoscopy 5 years ago Providers:             Jonathon Bellows MD, MD Medicines:             Monitored Anesthesia Care Complications:         No immediate complications. Procedure:             Pre-Anesthesia Assessment:                        - Prior to the procedure, a History and Physical was                         performed, and patient medications, allergies and                         sensitivities were reviewed. The patient's tolerance                         of previous anesthesia was reviewed.                        - The risks and benefits of the procedure and the                         sedation options and risks were discussed with the                         patient. All questions were answered and informed                         consent was obtained.                        - ASA Grade Assessment: II - A patient with mild                         systemic disease.                        After obtaining informed consent, the colonoscope was                         passed under direct vision. Throughout the procedure,                         the patient's blood pressure, pulse, and oxygen                         saturations were monitored continuously. The  Colonoscope was introduced through the anus and                         advanced to the the cecum, identified by the                         appendiceal orifice. The colonoscopy was performed                         with ease. The patient tolerated  the procedure well.                         The quality of the bowel preparation was excellent. Findings:      The perianal and digital rectal examinations were normal.      A 5 mm polyp was found in the sigmoid colon. The polyp was sessile. The       polyp was removed with a cold snare. Resection and retrieval were       complete.      No additional abnormalities were found on retroflexion.      The exam was otherwise without abnormality on direct and retroflexion       views. Impression:            - One 5 mm polyp in the sigmoid colon, removed with a                         cold snare. Resected and retrieved.                        - The examination was otherwise normal on direct and                         retroflexion views. Recommendation:        - Discharge patient to home (with escort).                        - Resume previous diet.                        - Continue present medications.                        - Await pathology results.                        - Repeat colonoscopy for surveillance based on                         pathology results. Procedure Code(s):     --- Professional ---                        [phone], Colonoscopy, flexible; with removal of                         tumor(s), polyp(s), or other lesion(s) by snare                         technique Diagnosis Code(s):     --- Professional ---  Z86.010, Personal history of colonic polyps                        K63.5, Polyp of colon CPT copyright 2019 American Medical Association. All rights reserved. The codes documented in this report are preliminary and upon coder review may  be revised to meet current compliance requirements. Jonathon Bellows, MD Jonathon Bellows MD, MD 04/08/2022 11:24:42 AM This report has been signed electronically. Number of Addenda: 0 Note Initiated On: 04/08/2022 11:07 AM Scope Withdrawal Time: 0 hours 8 minutes 28 seconds  Total Procedure Duration: 0 hours 10 minutes 39  seconds  Estimated Blood Loss:  Estimated blood loss: none.      [facility]

## 2022-04-09 ENCOUNTER — Encounter: Payer: Self-pay | Admitting: Gastroenterology

## 2022-04-11 LAB — SURGICAL PATHOLOGY

## 2022-04-21 ENCOUNTER — Encounter: Payer: Self-pay | Admitting: Gastroenterology

## 2022-07-04 ENCOUNTER — Other Ambulatory Visit: Payer: Self-pay | Admitting: Family Medicine

## 2022-07-04 DIAGNOSIS — I10 Essential (primary) hypertension: Secondary | ICD-10-CM

## 2022-07-04 DIAGNOSIS — K219 Gastro-esophageal reflux disease without esophagitis: Secondary | ICD-10-CM

## 2022-07-20 NOTE — Progress Notes (Unsigned)
07/21/2022 3:51 PM   Keith Villegas February 15, 1956 [id number]  Referring provider: Birdie Sons, MD [address] [place],  [place] [address]  Urological history 1. ED -contributing factors of age, HTN, HLD, obesity and DM -SHIM 17 -tadalafil 5 mg daily  2. BPH with LU TS -PSA (02/2022) 0.2 -I PSS 1/1   HPI: Keith Villegas is a 67 y.o. male who presents today for yearly follow up.    SHIM 17  Patient still having spontaneous erections.  He denies any pain or curvature with erections.   He has a history of testosterone deficiency.  He is having issues with low libido.     SHIM     Row Name 07/21/22 1515         SHIM: Over the last 6 months:   How do you rate your confidence that you could get and keep an erection? Moderate     When you had erections with sexual stimulation, how often were your erections hard enough for penetration (entering your partner)? Most Times (much more than half the time)     During sexual intercourse, how often were you able to maintain your erection after you had penetrated (entered) your partner? Most Times (much more than half the time)     During sexual intercourse, how difficult was it to maintain your erection to completion of intercourse? Very Difficult     When you attempted sexual intercourse, how often was it satisfactory for you? Most Times (much more than half the time)       SHIM Total Score   SHIM 17               Score: 1-7 Severe ED 8-11 Moderate ED 12-16 Mild-Moderate ED 17-21 Mild ED 22-25 No ED    I PSS 1/1  He has no urinary complaints at this time.  Patient denies any modifying or aggravating factors.  Patient denies any gross hematuria, dysuria or suprapubic/flank pain.  Patient denies any fevers, chills, nausea or vomiting.     IPSS     Row Name 07/21/22 1500         International Prostate Symptom Score   How often have you had the sensation of not emptying your bladder? Not at All      How often have you had to urinate less than every two hours? Not at All     How often have you found you stopped and started again several times when you urinated? Not at All     How often have you found it difficult to postpone urination? Not at All     How often have you had a weak urinary stream? Not at All     How often have you had to strain to start urination? Not at All     How many times did you typically get up at night to urinate? 1 Time     Total IPSS Score 1       Quality of Life due to urinary symptoms   If you were to spend the rest of your life with your urinary condition just the way it is now how would you feel about that? Pleased               Score:  1-7 Mild 8-19 Moderate 20-35 Severe   PMH: Past Medical History:  Diagnosis Date   Costochondritis 09/12/2015   GERD (gastroesophageal reflux disease)    Hypertension  Surgical History: Past Surgical History:  Procedure Laterality Date   APPENDECTOMY  1976   COLONOSCOPY WITH PROPOFOL N/A 11/20/2016   Procedure: COLONOSCOPY WITH PROPOFOL;  Surgeon: Jonathon Bellows, MD;  Location: [facility] ENDOSCOPY;  Service: Endoscopy;  Laterality: N/A;   COLONOSCOPY WITH PROPOFOL N/A 04/08/2022   Procedure: COLONOSCOPY WITH PROPOFOL;  Surgeon: Jonathon Bellows, MD;  Location: [facility] ENDOSCOPY;  Service: Gastroenterology;  Laterality: N/A;   echocardiogram  10/02/2010   Myocardial perfusion scan  10/02/2010   normal perfusion. Normal systolic function. Borderline right ventricular dilation. EF>65%   TOE SURGERY  2005    Home Medications:  Allergies as of 07/21/2022   No Known Allergies      Medication List        Accurate as of July 21, 2022 11:59 PM. If you have any questions, ask your nurse or doctor.          aspirin 81 MG tablet Take 81 mg by mouth daily.   gabapentin 100 MG capsule Commonly known as: NEURONTIN Take 1 capsule (100 mg total) by mouth 3 (three) times daily.   hydrocortisone-pramoxine 2.5-1 %  rectal cream Commonly known as: Analpram HC Place 1 application rectally 3 (three) times daily.   lisinopril-hydrochlorothiazide 20-25 MG tablet Commonly known as: ZESTORETIC TAKE 1 TABLET BY MOUTH ONCE DAILY   metoprolol succinate 50 MG 24 hr tablet Commonly known as: TOPROL-XL TAKE 1 TABLET BY MOUTH ONCE DAILY WITH OR IMMEDIATELY FOLLOWING A MEAL.   multivitamin capsule Take 1 capsule by mouth daily.   neomycin-polymyxin-hydrocortisone OTIC solution Commonly known as: CORTISPORIN 3-4 drops both ears as neeeded   omeprazole 40 MG capsule Commonly known as: PRILOSEC TAKE 1 CAPSULE BY MOUTH DAILY   rosuvastatin 20 MG tablet Commonly known as: Crestor Take 1 tablet (20 mg total) by mouth daily.   tadalafil 5 MG tablet Commonly known as: CIALIS TAKE ONE TO TWO TABLETS BY MOUTH EVERY DAY        Allergies: No Known Allergies  Family History: Family History  Problem Relation Age of Onset   Cancer Mother        Dementia & Schizophrenia   CAD Father    Colon cancer Neg Hx    Prostate cancer Neg Hx     Social History:  reports that he has quit smoking. He has never used smokeless tobacco. He reports that he does not drink alcohol and does not use drugs.  ROS: Pertinent ROS in HPI  Physical Exam: BP (!) 154/90   Pulse 72   Ht '5\' 11"'$  (1.803 m)   Wt 240 lb (108.9 kg)   BMI 33.47 kg/m   Constitutional:  Well nourished. Alert and oriented, No acute distress. HEENT: [place] AT, moist mucus membranes.  Trachea midline Cardiovascular: No clubbing, cyanosis, or edema. Respiratory: Normal respiratory effort, no increased work of breathing. GU: No CVA tenderness.  No bladder fullness or masses.  Patient with uncircumcised phallus.  Foreskin easily retracted  Urethral meatus is patent.  No penile discharge. No penile lesions or rashes. Scrotum without lesions, cysts, rashes and/or edema.  Testicles are located scrotally bilaterally. No masses are appreciated in the testicles.  Left and right epididymis are normal. Rectal: Patient with  normal sphincter tone. Anus and perineum without scarring or rashes. No rectal masses are appreciated. Prostate is approximately 50 + grams, could only palpate the apex and the midportion of the gland, no nodules are appreciated. Seminal vesicles could not be palpated. Neurologic: Grossly intact, no focal deficits,  moving all 4 extremities. Psychiatric: Normal mood and affect.   Laboratory Data: Lab Results  Component Value Date   WBC 9.7 03/07/2022   HGB 14.0 03/07/2022   HCT 42.8 03/07/2022   MCV 87 03/07/2022   PLT 229 03/07/2022    Lab Results  Component Value Date   CREATININE 1.09 03/07/2022    Lab Results  Component Value Date   HGBA1C 6.0 (H) 03/07/2022       Component Value Date/Time   CHOL 200 (H) 03/07/2022 1004   HDL 51 03/07/2022 1004   CHOLHDL 3.9 03/07/2022 1004   LDLCALC 120 (H) 03/07/2022 1004    Lab Results  Component Value Date   AST 16 03/07/2022   Lab Results  Component Value Date   ALT 14 03/07/2022   Component     Latest Ref Rng 03/07/2022  Prostate Specific Ag, Serum     0.0 - 4.0 ng/mL 0.2   Reflex Criteria Comment   I have reviewed the labs.  Pertinent Imaging  07/21/22 15:15  Scan Result 0     Assessment & Plan:    1. Erectile dysfunction -Has noted a decrease in his libido -We will draw testosterone at this time, but I advised him that if it returns below normal value we will still need to repeat it for a morning specimen for confirmation -Continue tadalafil 5 mg daily  2. BPH with LUTS -PSA stable -DRE benign -continue conservative management, avoiding bladder irritants and timed voiding's -Continue tadalafil 5 mg daily    Return in about 1 year (around 07/22/2023) for IPSS, SHIM, PSA and exam.  These notes generated with voice recognition software. I apologize for typographical errors.  [place], [place] [address]  [place], [place] [address] [phone]

## 2022-07-21 ENCOUNTER — Ambulatory Visit: Payer: BC Managed Care – PPO | Admitting: Urology

## 2022-07-21 ENCOUNTER — Encounter: Payer: Self-pay | Admitting: Urology

## 2022-07-21 VITALS — BP 154/90 | HR 72 | Ht 71.0 in | Wt 240.0 lb

## 2022-07-21 DIAGNOSIS — N138 Other obstructive and reflux uropathy: Secondary | ICD-10-CM | POA: Diagnosis not present

## 2022-07-21 DIAGNOSIS — N529 Male erectile dysfunction, unspecified: Secondary | ICD-10-CM

## 2022-07-21 DIAGNOSIS — R6882 Decreased libido: Secondary | ICD-10-CM

## 2022-07-21 DIAGNOSIS — N401 Enlarged prostate with lower urinary tract symptoms: Secondary | ICD-10-CM | POA: Diagnosis not present

## 2022-07-21 DIAGNOSIS — R7989 Other specified abnormal findings of blood chemistry: Secondary | ICD-10-CM

## 2022-07-21 LAB — BLADDER SCAN AMB NON-IMAGING: Scan Result: 0

## 2022-07-23 ENCOUNTER — Telehealth: Payer: Self-pay | Admitting: *Deleted

## 2022-07-23 LAB — TESTOSTERONE: Testosterone: 150 ng/dL — ABNORMAL LOW (ref 264–916)

## 2022-07-23 NOTE — Telephone Encounter (Signed)
-----  Message from Nori Riis, PA-C sent at 07/23/2022  9:31 AM EST ----- Please let Mr. Kimbrell know that his testosterone returned low.  We will need to repeat it before 10 am.

## 2022-07-25 NOTE — Telephone Encounter (Signed)
Notified patient as instructed, patient pleased. Discussed follow-up appointments, patient agrees  

## 2022-07-29 ENCOUNTER — Other Ambulatory Visit: Payer: Self-pay | Admitting: Family Medicine

## 2022-07-29 DIAGNOSIS — R7989 Other specified abnormal findings of blood chemistry: Secondary | ICD-10-CM

## 2022-08-01 ENCOUNTER — Other Ambulatory Visit
Admission: RE | Admit: 2022-08-01 | Discharge: 2022-08-01 | Disposition: A | Discharge status: Home / Self Care | Payer: BC Managed Care – PPO | Attending: Urology | Admitting: Urology

## 2022-08-01 ENCOUNTER — Other Ambulatory Visit: Payer: BC Managed Care – PPO

## 2022-08-01 DIAGNOSIS — R7989 Other specified abnormal findings of blood chemistry: Secondary | ICD-10-CM | POA: Diagnosis not present

## 2022-08-03 LAB — TESTOSTERONE: Testosterone: 124 ng/dL — ABNORMAL LOW (ref 264–916)

## 2022-08-04 ENCOUNTER — Other Ambulatory Visit: Payer: Self-pay

## 2022-08-04 DIAGNOSIS — R7989 Other specified abnormal findings of blood chemistry: Secondary | ICD-10-CM

## 2022-08-04 DIAGNOSIS — N529 Male erectile dysfunction, unspecified: Secondary | ICD-10-CM

## 2022-08-05 ENCOUNTER — Other Ambulatory Visit
Admission: RE | Admit: 2022-08-05 | Discharge: 2022-08-05 | Disposition: A | Discharge status: Home / Self Care | Payer: BC Managed Care – PPO | Attending: Urology | Admitting: Urology

## 2022-08-05 DIAGNOSIS — N529 Male erectile dysfunction, unspecified: Secondary | ICD-10-CM | POA: Insufficient documentation

## 2022-08-05 DIAGNOSIS — R7989 Other specified abnormal findings of blood chemistry: Secondary | ICD-10-CM | POA: Diagnosis not present

## 2022-08-06 ENCOUNTER — Telehealth: Payer: Self-pay | Admitting: Family Medicine

## 2022-08-06 LAB — TESTOSTERONE: Testosterone: 88 ng/dL — ABNORMAL LOW (ref 264–916)

## 2022-08-06 LAB — PROLACTIN: Prolactin: 11.5 ng/mL (ref 3.6–25.2)

## 2022-08-06 LAB — LUTEINIZING HORMONE: LH: 2.1 m[IU]/mL (ref 1.7–8.6)

## 2022-08-06 NOTE — Telephone Encounter (Signed)
-----   Message from Nori Riis, PA-C sent at 08/06/2022 10:39 AM EST ----- Please let Mr. Blazejewski know that his second panel blood work has returned with a low testosterone and that we need to get him started on testosterone replacement therapy.  He had been on a form of testosterone therapy in the past, we could restart that modality or we can have another appointment and talk about other treatment options.

## 2022-08-06 NOTE — Telephone Encounter (Signed)
Patient notified and wants to come in to discuss options. Appointment has been made.

## 2022-08-13 NOTE — Progress Notes (Unsigned)
07/21/2022 3:51 PM   [name] 02-22-1956 [id number]  Referring provider: Birdie Sons, MD [address] [place],  [place] [address]  Urological history 1. ED -contributing factors of age, HTN, HLD, obesity and DM -tadalafil 5 mg daily  2. BPH with LU TS -PSA (02/2022) 0.2   HPI: Keith Villegas is a 67 y.o. male who presents today to discuss TRT.   He and his wife had a very successful sexual interlude this morning.  He feels that maybe he is just not patient enough to let the PDE 5 inhibitor "kick in."     PMH: Past Medical History:  Diagnosis Date   Costochondritis 09/12/2015   GERD (gastroesophageal reflux disease)    Hypertension     Surgical History: Past Surgical History:  Procedure Laterality Date   APPENDECTOMY  1976   COLONOSCOPY WITH PROPOFOL N/A 11/20/2016   Procedure: COLONOSCOPY WITH PROPOFOL;  Surgeon: Jonathon Bellows, MD;  Location: [facility] ENDOSCOPY;  Service: Endoscopy;  Laterality: N/A;   COLONOSCOPY WITH PROPOFOL N/A 04/08/2022   Procedure: COLONOSCOPY WITH PROPOFOL;  Surgeon: Jonathon Bellows, MD;  Location: [facility] ENDOSCOPY;  Service: Gastroenterology;  Laterality: N/A;   echocardiogram  10/02/2010   Myocardial perfusion scan  10/02/2010   normal perfusion. Normal systolic function. Borderline right ventricular dilation. EF>65%   TOE SURGERY  2005    Home Medications:  Allergies as of 07/21/2022   No Known Allergies      Medication List        Accurate as of July 21, 2022 11:59 PM. If you have any questions, ask your nurse or doctor.          aspirin 81 MG tablet Take 81 mg by mouth daily.   gabapentin 100 MG capsule Commonly known as: NEURONTIN Take 1 capsule (100 mg total) by mouth 3 (three) times daily.   hydrocortisone-pramoxine 2.5-1 % rectal cream Commonly known as: Analpram HC Place 1 application rectally 3 (three) times daily.   lisinopril-hydrochlorothiazide 20-25 MG tablet Commonly known as:  ZESTORETIC TAKE 1 TABLET BY MOUTH ONCE DAILY   metoprolol succinate 50 MG 24 hr tablet Commonly known as: TOPROL-XL TAKE 1 TABLET BY MOUTH ONCE DAILY WITH OR IMMEDIATELY FOLLOWING A MEAL.   multivitamin capsule Take 1 capsule by mouth daily.   neomycin-polymyxin-hydrocortisone OTIC solution Commonly known as: CORTISPORIN 3-4 drops both ears as neeeded   omeprazole 40 MG capsule Commonly known as: PRILOSEC TAKE 1 CAPSULE BY MOUTH DAILY   rosuvastatin 20 MG tablet Commonly known as: Crestor Take 1 tablet (20 mg total) by mouth daily.   tadalafil 5 MG tablet Commonly known as: CIALIS TAKE ONE TO TWO TABLETS BY MOUTH EVERY DAY        Allergies: No Known Allergies  Family History: Family History  Problem Relation Age of Onset   Cancer Mother        Dementia & Schizophrenia   CAD Father    Colon cancer Neg Hx    Prostate cancer Neg Hx     Social History:  reports that he has quit smoking. He has never used smokeless tobacco. He reports that he does not drink alcohol and does not use drugs.  ROS: Pertinent ROS in HPI  Physical Exam: BP (!) 154/90   Pulse 72   Ht 5' 11"$  (1.803 m)   Wt 240 lb (108.9 kg)   BMI 33.47 kg/m   Constitutional:  Well nourished. Alert and oriented, No acute distress. HEENT: [place]  AT, moist mucus membranes.  Trachea midline Cardiovascular: No clubbing, cyanosis, or edema. Respiratory: Normal respiratory effort, no increased work of breathing. Neurologic: Grossly intact, no focal deficits, moving all 4 extremities. Psychiatric: Normal mood and affect.   Laboratory Data: Component     Latest Ref Rng 08/05/2022  LH     1.7 - 8.6 mIU/mL 2.1    Component     Latest Ref Rng 08/05/2022  Testosterone     264 - 916 ng/dL 88 (L)     Legend: (L) Low  Component     Latest Ref Rng 08/05/2022  Prolactin     3.6 - 25.2 ng/mL 11.5   I have reviewed the labs.  Pertinent Imaging N/A    Assessment & Plan:    1. Testosterone  deficiency -We discussed the most common forms of replacement including intramuscular injection and gels and he desires to start injections -Potential side effects of testosterone replacement were discussed including stimulation of benign prostatic growth with lower urinary tract symptoms; erythrocytosis; edema; gynecomastia; worsening sleep apnea; venous thromboembolism; testicular atrophy and infertility. Recent studies suggesting an increased incidence of heart attack and stroke in patients taking testosterone was discussed. He was informed there is conflicting evidence regarding the impact of testosterone therapy on cardiovascular risk. The theoretical risk of growth stimulation of an undetected prostate cancer was also discussed.  He was informed that current evidence does not provide any definitive answers regarding the risks of testosterone therapy on prostate cancer and cardiovascular disease. The need for periodic monitoring of his testosterone level, PSA, hematocrit and DRE was discussed. -At this time he would like to hold off on any TRT citing cost, other medical issues and side effects    Return in about 1 year (around 07/22/2023) for IPSS, SHIM, PSA and exam.  These notes generated with voice recognition software. I apologize for typographical errors.  Royden Purl  [place] [address]  [place], [place] [address] [phone]  I spent 15 minutes on the day of the encounter to include pre-visit record review, face-to-face time with the patient, and post-visit ordering of tests.

## 2022-08-14 ENCOUNTER — Encounter: Payer: Self-pay | Admitting: Urology

## 2022-08-14 ENCOUNTER — Ambulatory Visit (INDEPENDENT_AMBULATORY_CARE_PROVIDER_SITE_OTHER): Payer: BC Managed Care – PPO | Admitting: Urology

## 2022-08-14 VITALS — BP 155/79 | HR 76 | Ht 71.0 in | Wt 235.0 lb

## 2022-08-14 DIAGNOSIS — E291 Testicular hypofunction: Secondary | ICD-10-CM

## 2022-08-14 DIAGNOSIS — E349 Endocrine disorder, unspecified: Secondary | ICD-10-CM

## 2022-08-14 DIAGNOSIS — N138 Other obstructive and reflux uropathy: Secondary | ICD-10-CM

## 2022-08-14 NOTE — Addendum Note (Signed)
Addended by: Kris Mouton on: 08/14/2022 02:41 PM   Modules accepted: Orders

## 2022-09-04 NOTE — Progress Notes (Signed)
I,Sha'taria Tyson,acting as a Education administrator for Lelon Huh, MD.,have documented all relevant documentation on the behalf of Lelon Huh, MD,as directed by  Lelon Huh, MD while in the presence of Lelon Huh, MD.   Established patient visit   Patient: Keith Villegas   DOB: 30-Apr-1956   66 y.o. Male  MRN: [id number] Visit Date: 09/05/2022  Today's healthcare provider: Lelon Huh, MD   Chief Complaint  Patient presents with   Hypertension   Prediabetes   Subjective    HPI  -Lung Cancer Screening declined -Shingles Vaccine: States he received at pharmacy // no record in [place] // provided patient with office fax number to have records sent over Prediabetes, Follow-up  Lab Results  Component Value Date   HGBA1C 6.0 (H) 03/07/2022   HGBA1C 6.3 (H) 03/05/2021   HGBA1C 6.1 (H) 04/09/2020   GLUCOSE 107 (H) 03/07/2022   GLUCOSE 110 (H) 06/07/2021   GLUCOSE 107 (H) 03/05/2021    Last seen for for this 6 months ago.  Management since that visit includes none. Current symptoms include none and have been unchanged.  Prior visit with dietician: no  Hypertension, follow-up  BP Readings from Last 3 Encounters:  08/14/22 (!) 155/79  07/21/22 (!) 154/90  04/08/22 (!) 165/77   Wt Readings from Last 3 Encounters:  08/14/22 235 lb (106.6 kg)  07/21/22 240 lb (108.9 kg)  04/08/22 235 lb (106.6 kg)     He was last seen for hypertension 6 months ago.  BP at that visit was 125/69. Management since that visit includes Continue current medications.   Marland Kitchen He reports excellent compliance with treatment. Outside blood pressures are 130-140/70-80  Lipid/Cholesterol, follow-up  Last Lipid Panel: Lab Results  Component Value Date   CHOL 200 (H) 03/07/2022   LDLCALC 120 (H) 03/07/2022   HDL 51 03/07/2022   TRIG 164 (H) 03/07/2022    He was last seen for this 6 months ago.  Management since that visit includes Try to cut back on saturated fat in diet. Continue current  medications.   Symptoms: No appetite changes No foot ulcerations  No chest pain No chest pressure/discomfort  No dyspnea No orthopnea  No fatigue No lower extremity edema  Yes palpitations No paroxysmal nocturnal dyspnea  No nausea No numbness or tingling of extremity  No polydipsia No polyuria  No speech difficulty No syncope    Last metabolic panel Lab Results  Component Value Date   GLUCOSE 107 (H) 03/07/2022   NA 139 03/07/2022   K 4.1 03/07/2022   BUN 17 03/07/2022   CREATININE 1.09 03/07/2022   EGFR 75 03/07/2022   GFRNONAA 69 04/09/2020   CALCIUM 9.9 03/07/2022   AST 16 03/07/2022   ALT 14 03/07/2022   The 10-year ASCVD risk score (Arnett DK, et al., 2019) is: 22.2%  ---------------------------------------------------------------------------------------------------   Medications: Outpatient Medications Prior to Visit  Medication Sig   aspirin 81 MG tablet Take 81 mg by mouth daily.    gabapentin (NEURONTIN) 100 MG capsule Take 1 capsule (100 mg total) by mouth 3 (three) times daily.   hydrocortisone-pramoxine (ANALPRAM HC) 2.5-1 % rectal cream Place 1 application rectally 3 (three) times daily.   lisinopril-hydrochlorothiazide (ZESTORETIC) 20-25 MG tablet TAKE 1 TABLET BY MOUTH ONCE DAILY   methocarbamol (ROBAXIN) 500 MG tablet Take 500 mg by mouth every 6 (six) hours as needed for muscle spasms.   metoprolol succinate (TOPROL-XL) 50 MG 24 hr tablet TAKE 1 TABLET BY MOUTH ONCE DAILY  WITH OR IMMEDIATELY FOLLOWING A MEAL.   Multiple Vitamin (MULTIVITAMIN) capsule Take 1 capsule by mouth daily.   neomycin-polymyxin-hydrocortisone (CORTISPORIN) OTIC solution 3-4 drops both ears as neeeded   omeprazole (PRILOSEC) 40 MG capsule TAKE 1 CAPSULE BY MOUTH DAILY   rosuvastatin (CRESTOR) 20 MG tablet Take 1 tablet (20 mg total) by mouth daily.   tadalafil (CIALIS) 5 MG tablet TAKE ONE TO TWO TABLETS BY MOUTH EVERY DAY   No facility-administered medications prior to visit.     Review of Systems  Constitutional:  Negative for appetite change, chills and fever.  Respiratory:  Negative for chest tightness, shortness of breath and wheezing.   Cardiovascular:  Negative for chest pain and palpitations.  Gastrointestinal:  Negative for abdominal pain, nausea and vomiting.      Objective    BP (!) 147/67 (BP Location: Left Arm, Patient Position: Sitting, Cuff Size: Large)   Wt 237 lb 14.4 oz (107.9 kg)   SpO2 100%   BMI 33.18 kg/m    Physical Exam  General appearance: Mildly obese male, cooperative and in no acute distress Head: Normocephalic, without obvious abnormality, atraumatic Respiratory: Respirations even and unlabored, normal respiratory rate Extremities: All extremities are intact.  Skin: Skin color, texture, turgor normal. No rashes seen  Psych: Appropriate mood and affect. Neurologic: Mental status: Alert, oriented to person, place, and time, thought content appropriate.   Results for orders placed or performed in visit on 09/05/22  POCT HgB A1C  Result Value Ref Range   Hemoglobin A1C 5.9 (A) 4.0 - 5.6 %   Est. average glucose Bld gHb Est-mCnc 123     Assessment & Plan     1. Prediabetes Diet controlled. He does have some room to cut back on sweets in diet which he is going to do.   2. Primary hypertension Uncontrolled. He is being treated for several lumbar stenosis and bulging discs at [facility] which may be contributing to elevated blood pressure. Continue current medications.  add amLODipine (NORVASC) 2.5 MG tablet; Take 1 tablet (2.5 mg total) by mouth daily.  Dispense: 90 tablet; Refill: 1  3. Lumbar foraminal stenosis Continue follow up [facility] as scheduled.   Future Appointments  Date Time Provider Nikolai  03/09/2023  8:20 AM Birdie Sons, MD [facility]  08/07/2023  2:00 PM [facility] None  08/13/2023  1:30 PM McGowan, Larene Beach A, PA-C [facility] None         .[place]    Lelon Huh, MD  [place] [phone] (phone) [phone] (fax)  [place]

## 2022-09-05 ENCOUNTER — Ambulatory Visit: Payer: BC Managed Care – PPO | Admitting: Family Medicine

## 2022-09-05 ENCOUNTER — Encounter: Payer: Self-pay | Admitting: Family Medicine

## 2022-09-05 VITALS — BP 147/67 | Wt 237.9 lb

## 2022-09-05 DIAGNOSIS — I10 Essential (primary) hypertension: Secondary | ICD-10-CM

## 2022-09-05 DIAGNOSIS — M48061 Spinal stenosis, lumbar region without neurogenic claudication: Secondary | ICD-10-CM

## 2022-09-05 DIAGNOSIS — R7303 Prediabetes: Secondary | ICD-10-CM

## 2022-09-05 LAB — POCT GLYCOSYLATED HEMOGLOBIN (HGB A1C)
Est. average glucose Bld gHb Est-mCnc: 123
Hemoglobin A1C: 5.9 % — AB (ref 4.0–5.6)

## 2022-09-05 MED ORDER — AMLODIPINE BESYLATE 2.5 MG PO TABS: 2.5000 mg | ORAL_TABLET | Freq: Every day | ORAL | 1 refills | Status: DC

## 2022-09-05 NOTE — Patient Instructions (Signed)
.   Please review the attached list of medications and notify my office if there are any errors.   . Please bring all of your medications to every appointment so we can make sure that our medication list is the same as yours.   

## 2022-09-26 ENCOUNTER — Other Ambulatory Visit: Payer: Self-pay | Admitting: Family Medicine

## 2022-09-26 DIAGNOSIS — I10 Essential (primary) hypertension: Secondary | ICD-10-CM

## 2022-09-26 DIAGNOSIS — E785 Hyperlipidemia, unspecified: Secondary | ICD-10-CM

## 2022-09-26 MED ORDER — LISINOPRIL-HYDROCHLOROTHIAZIDE 20-25 MG PO TABS: 1.0000 | ORAL_TABLET | Freq: Every day | ORAL | 4 refills | Status: DC

## 2022-09-26 MED ORDER — ROSUVASTATIN CALCIUM 20 MG PO TABS: 20.0000 mg | ORAL_TABLET | Freq: Every day | ORAL | 4 refills | Status: DC

## 2022-09-26 MED ORDER — METOPROLOL SUCCINATE ER 50 MG PO TB24: ORAL_TABLET | ORAL | 4 refills | Status: DC

## 2022-09-30 ENCOUNTER — Other Ambulatory Visit: Payer: Self-pay | Admitting: Family Medicine

## 2022-09-30 DIAGNOSIS — I10 Essential (primary) hypertension: Secondary | ICD-10-CM

## 2022-09-30 DIAGNOSIS — K219 Gastro-esophageal reflux disease without esophagitis: Secondary | ICD-10-CM

## 2022-10-01 ENCOUNTER — Other Ambulatory Visit: Payer: Self-pay | Admitting: Family Medicine

## 2022-10-01 DIAGNOSIS — E785 Hyperlipidemia, unspecified: Secondary | ICD-10-CM

## 2022-10-30 ENCOUNTER — Other Ambulatory Visit: Payer: Self-pay | Admitting: Family Medicine

## 2022-10-30 ENCOUNTER — Telehealth: Payer: Self-pay | Admitting: Urology

## 2022-10-30 DIAGNOSIS — N529 Male erectile dysfunction, unspecified: Secondary | ICD-10-CM

## 2022-10-30 MED ORDER — TADALAFIL 5 MG PO TABS
ORAL_TABLET | ORAL | 0 refills | Status: DC
Start: 2022-10-30 — End: 2023-05-13

## 2022-10-30 NOTE — Telephone Encounter (Signed)
Patient notified RX sent to pharmacy.

## 2022-10-30 NOTE — Telephone Encounter (Signed)
Patient requesting refill for Tadalafil 5 mg. [facility] is [facility] [place]

## 2022-12-28 ENCOUNTER — Other Ambulatory Visit: Payer: Self-pay | Admitting: Family Medicine

## 2022-12-28 DIAGNOSIS — K219 Gastro-esophageal reflux disease without esophagitis: Secondary | ICD-10-CM

## 2022-12-29 NOTE — Telephone Encounter (Signed)
Requested Prescriptions  Pending Prescriptions Disp Refills   omeprazole (PRILOSEC) 40 MG capsule [Pharmacy Med Name: OMEPRAZOLE 40MG  CAPSULES] 90 capsule 0    Sig: TAKE 1 CAPSULE BY MOUTH DAILY     Gastroenterology: Proton Pump Inhibitors Passed - 12/28/2022  1:11 AM      Passed - Valid encounter within last 12 months    Recent Outpatient Visits           3 months ago Prediabetes   [facility] Malva Limes, MD   9 months ago Annual physical exam   [facility] Malva Limes, MD   1 year ago Hyperlipidemia, unspecified hyperlipidemia type   [facility] Malva Limes, MD   1 year ago Annual physical exam   [facility] Malva Limes, MD   2 years ago Ataxia   [facility] Malva Limes, MD       Future Appointments             In 2 months Fisher, Demetrios Isaacs, MD [facility]   In 7 months McGowan, Elana Alm [facility]

## 2023-03-03 ENCOUNTER — Other Ambulatory Visit: Payer: Self-pay | Admitting: Family Medicine

## 2023-03-03 DIAGNOSIS — I10 Essential (primary) hypertension: Secondary | ICD-10-CM

## 2023-03-09 ENCOUNTER — Encounter: Payer: BC Managed Care – PPO | Admitting: Family Medicine

## 2023-03-17 ENCOUNTER — Ambulatory Visit (INDEPENDENT_AMBULATORY_CARE_PROVIDER_SITE_OTHER): Payer: BC Managed Care – PPO | Admitting: Family Medicine

## 2023-03-17 VITALS — BP 147/77 | HR 65 | Temp 98.6°F | Ht 71.0 in | Wt 243.0 lb

## 2023-03-17 DIAGNOSIS — Z0000 Encounter for general adult medical examination without abnormal findings: Secondary | ICD-10-CM | POA: Diagnosis not present

## 2023-03-17 DIAGNOSIS — I440 Atrioventricular block, first degree: Secondary | ICD-10-CM | POA: Insufficient documentation

## 2023-03-17 DIAGNOSIS — I10 Essential (primary) hypertension: Secondary | ICD-10-CM

## 2023-03-17 DIAGNOSIS — G8929 Other chronic pain: Secondary | ICD-10-CM

## 2023-03-17 DIAGNOSIS — M48061 Spinal stenosis, lumbar region without neurogenic claudication: Secondary | ICD-10-CM

## 2023-03-17 DIAGNOSIS — Z125 Encounter for screening for malignant neoplasm of prostate: Secondary | ICD-10-CM

## 2023-03-17 DIAGNOSIS — R7303 Prediabetes: Secondary | ICD-10-CM

## 2023-03-17 DIAGNOSIS — E785 Hyperlipidemia, unspecified: Secondary | ICD-10-CM

## 2023-03-17 DIAGNOSIS — M545 Low back pain, unspecified: Secondary | ICD-10-CM

## 2023-03-17 MED ORDER — MELOXICAM 15 MG PO TABS
15.0000 mg | ORAL_TABLET | Freq: Every day | ORAL | 3 refills | Status: AC
Start: 2023-03-17 — End: ?

## 2023-03-17 NOTE — Progress Notes (Signed)
Complete physical exam   Patient: Keith Villegas   DOB: [birth year]   67 y.o. Male  MRN: [id number] Visit Date: 03/17/2023  Today's healthcare provider: Mila Merry, MD   Chief Complaint  Patient presents with   Annual Exam    Patient's only complaint is that of continued back pain.  He states he has received injections but the last 2 have not helped.  Patient also reports his BP is up a little due to the fact he had not taken his medication yet.   Subjective    Discussed the use of AI scribe software for clinical note transcription with the patient, who gave verbal consent to proceed.  History of Present Illness   The patient, with a history of back pain with lumbar stenosis, presents for a routine physical. He reports persistent back pain that has not improved despite two previous injections at the [facility]. The pain is severe enough to limit his daily activities, including walking for exercise and performing his job, which involves frequent movement and stair climbing. The patient was last seen at a spine and back center in [place] in April, where an MRI in February revealed broad based lumbar disc bulgers and foraminal stenosis.  The patient has tried physical therapy, but found it exacerbated his pain. He has also tried lidocaine patches, which provided temporary relief. He has not been taking any anti-inflammatory medications, but has previously tried Naprosyn and Meloxicam in years past with some improvement.  In addition to his back pain, the patient has been managing his weight and diet. He reports a reduction in sweet and starchy foods and has been trying to walk more, although this has been limited by his back pain. Despite these efforts, he has not noticed significant changes in his weight.  The patient also has a history of hypertension and prediabetes, and is on blood pressure medication, which he did not take on the day of the consultation. He reports no  issues with his current blood pressure medication.  The patient recently received his flu and COVID-19 vaccines at his workplace. He reports no adverse reactions to these vaccines.      HPI     Annual Exam    Additional comments: Patient's only complaint is that of continued back pain.  He states he has received injections but the last 2 have not helped.  Patient also reports his BP is up a little due to the fact he had not taken his medication yet.      Last edited by Adline Peals, CMA on 03/17/2023  9:13 AM.       Past Medical History:  Diagnosis Date   Adenomatous polyp of colon    Costochondritis 09/12/2015   GERD (gastroesophageal reflux disease)    Hypertension    Past Surgical History:  Procedure Laterality Date   APPENDECTOMY  1976   COLONOSCOPY WITH PROPOFOL N/A 11/20/2016   Procedure: COLONOSCOPY WITH PROPOFOL;  Surgeon: Wyline Mood, MD;  Location: [facility] ENDOSCOPY;  Service: Endoscopy;  Laterality: N/A;   COLONOSCOPY WITH PROPOFOL N/A 04/08/2022   Procedure: COLONOSCOPY WITH PROPOFOL;  Surgeon: Wyline Mood, MD;  Location: [facility] ENDOSCOPY;  Service: Gastroenterology;  Laterality: N/A;   echocardiogram  10/02/2010   Myocardial perfusion scan  10/02/2010   normal perfusion. Normal systolic function. Borderline right ventricular dilation. EF>65%   TOE SURGERY  2005   Social History   Socioeconomic History   Marital status: Married  Spouse name: Not on file   Number of children: 2   Years of education: Not on file   Highest education level: Not on file  Occupational History   Occupation: Tourist information centre manager    Comment: works at [employer]  Tobacco Use   Smoking status: Former   Smokeless tobacco: Never  Vaping Use   Vaping status: Never Used  Substance and Sexual Activity   Alcohol use: No    Alcohol/week: 0.0 standard drinks of alcohol   Drug use: No   Sexual activity: Not on file  Other Topics Concern   Not on file  Social History Narrative    Not on file   Social Determinants of Health   Financial Resource Strain: Not on file  Food Insecurity: Not on file  Transportation Needs: Not on file  Physical Activity: Not on file  Stress: Not on file  Social Connections: Not on file  Intimate Partner Violence: Not on file   Family Status  Relation Name Status   Mother  Deceased       Cause of Death: COVID-19   Father  Deceased       72   Sister  Alive   Brother  Alive   Brother  Alive   Neg Hx  (Not Specified)  No partnership data on file   Family History  Problem Relation Age of Onset   Cancer Mother        Dementia & Schizophrenia   CAD Father    Colon cancer Neg Hx    Prostate cancer Neg Hx    No Known Allergies  Patient Care Team: Malva Limes, MD as PCP - General (Family Medicine) [place], Annye Rusk, DPM as Consulting Physician (Podiatry) System, Provider Not In [place], Larena Glassman, DPM as Consulting Physician (Podiatry) Tia Masker, MD (Physical Medicine and Rehabilitation) Lonell Face, MD as Consulting Physician (Neurology)   Medications: Outpatient Medications Prior to Visit  Medication Sig   amLODipine (NORVASC) 2.5 MG tablet TAKE 1 TABLET(2.5 MG) BY MOUTH DAILY   aspirin 81 MG tablet Take 81 mg by mouth daily.    gabapentin (NEURONTIN) 100 MG capsule Take 1 capsule (100 mg total) by mouth 3 (three) times daily.   hydrocortisone-pramoxine (ANALPRAM HC) 2.5-1 % rectal cream Place 1 application rectally 3 (three) times daily.   lisinopril-hydrochlorothiazide (ZESTORETIC) 20-25 MG tablet TAKE 1 TABLET BY MOUTH EVERY DAY   methocarbamol (ROBAXIN) 500 MG tablet Take 500 mg by mouth every 6 (six) hours as needed for muscle spasms.   metoprolol succinate (TOPROL-XL) 50 MG 24 hr tablet TAKE 1 TABLET BY MOUTH EVERY DAY WITH OR IMMEDIATELY FOLLOWING A MEAL   Multiple Vitamin (MULTIVITAMIN) capsule Take 1 capsule by mouth daily.   neomycin-polymyxin-hydrocortisone (CORTISPORIN) OTIC solution 3-4 drops both  ears as neeeded   omeprazole (PRILOSEC) 40 MG capsule TAKE 1 CAPSULE BY MOUTH DAILY   rosuvastatin (CRESTOR) 20 MG tablet Take 1 tablet (20 mg total) by mouth daily.   tadalafil (CIALIS) 5 MG tablet TAKE ONE TO TWO TABLETS BY MOUTH EVERY DAY   No facility-administered medications prior to visit.    Review of Systems  Constitutional:  Negative for chills, diaphoresis and fever.  HENT:  Negative for congestion, ear discharge, ear pain, hearing loss, nosebleeds, sore throat and tinnitus.   Eyes:  Negative for photophobia, pain, discharge and redness.  Respiratory:  Negative for cough, shortness of breath, wheezing and stridor.   Cardiovascular:  Negative for chest pain,  palpitations and leg swelling.  Gastrointestinal:  Negative for abdominal pain, blood in stool, constipation, diarrhea, nausea and vomiting.  Endocrine: Negative for polydipsia.  Genitourinary:  Negative for dysuria, flank pain, frequency, hematuria and urgency.  Musculoskeletal:  Negative for back pain, myalgias and neck pain.  Skin:  Negative for rash.  Allergic/Immunologic: Negative for environmental allergies.  Neurological:  Negative for dizziness, tremors, seizures, weakness and headaches.  Hematological:  Does not bruise/bleed easily.  Psychiatric/Behavioral:  Negative for hallucinations and suicidal ideas. The patient is not nervous/anxious.       Objective    BP (!) 147/77 (BP Location: Left Arm, Patient Position: Sitting, Cuff Size: Normal)   Pulse 65   Temp 98.6 F (37 C) (Oral)   Ht 5\' 11"  (1.803 m)   Wt 243 lb (110.2 kg)   SpO2 99%   BMI 33.89 kg/m    Physical Exam  General Appearance:    Obese male. Alert, cooperative, in no acute distress, appears stated age  Head:    Normocephalic, without obvious abnormality, atraumatic  Eyes:    PERRL, conjunctiva/corneas clear, EOM's intact, fundi    benign, both eyes       Ears:    Normal TM's and external ear canals, both ears  Nose:   Nares normal,  septum midline, mucosa normal, no drainage   or sinus tenderness  Throat:   Lips, mucosa, and tongue normal; teeth and gums normal  Neck:   Supple, symmetrical, trachea midline, no adenopathy;       thyroid:  No enlargement/tenderness/nodules; no carotid   bruit or JVD  Back:     Symmetric, no curvature, ROM normal, no CVA tenderness  Lungs:     Clear to auscultation bilaterally, respirations unlabored  Chest wall:    No tenderness or deformity  Heart:    Normal heart rate. Normal rhythm. No murmurs, rubs, or gallops.  S1 and S2 normal  Abdomen:     Soft, non-tender, bowel sounds active all four quadrants,    no masses, no organomegaly  Genitalia:    deferred  Rectal:    deferred  Extremities:   All extremities are intact. No cyanosis or edema  Pulses:   2+ and symmetric all extremities  Skin:   Skin color, texture, turgor normal, no rashes or lesions  Lymph nodes:   Cervical, supraclavicular, and axillary nodes normal  Neurologic:   CNII-XII intact. Normal strength, sensation and reflexes      throughout   Lab Results  Component Value Date   NA 139 03/07/2022   CL 99 03/07/2022   K 4.1 03/07/2022   CO2 27 03/07/2022   BUN 17 03/07/2022   CREATININE 1.09 03/07/2022   EGFR 75 03/07/2022   CALCIUM 9.9 03/07/2022   ALBUMIN 4.7 03/07/2022   GLUCOSE 107 (H) 03/07/2022   Lab Results  Component Value Date   HGBA1C 5.9 (A) 09/05/2022   Lab Results  Component Value Date   CHOL 200 (H) 03/07/2022   HDL 51 03/07/2022   LDLCALC 120 (H) 03/07/2022   TRIG 164 (H) 03/07/2022   CHOLHDL 3.9 03/07/2022       Last depression screening scores    03/17/2023    9:10 AM 09/05/2022    9:15 AM 03/07/2022    9:14 AM  PHQ 2/9 Scores  PHQ - 2 Score 0 0 0  PHQ- 9 Score 0 0 0   Last fall risk screening    03/17/2023    9:10 AM  Fall Risk   Falls in the past year? 0  Number falls in past yr: 0  Injury with Fall? 0   Last Audit-C alcohol use screening    03/17/2023    9:10 AM   Alcohol Use Disorder Test (AUDIT)  1. How often do you have a drink containing alcohol? 3  2. How many drinks containing alcohol do you have on a typical day when you are drinking? 0  3. How often do you have six or more drinks on one occasion? 0  AUDIT-C Score 3   A score of 3 or more in women, and 4 or more in men indicates increased risk for alcohol abuse, EXCEPT if all of the points are from question 1     Assessment & Plan    Routine Health Maintenance and Physical Exam  Exercise Activities and Dietary recommendations  Goals   None     Immunization History  Administered Date(s) Administered   Influenza Split 06/07/2011   Influenza,inj,Quad PF,6-35 Mos 04/06/2020   Influenza-Unspecified 04/10/2017, 04/07/2018   Janssen (J&J) SARS-COV-2 Vaccination 08/29/2019, 04/18/2020   Moderna Sars-Covid-2 Vaccination 03/04/2021   PNEUMOCOCCAL CONJUGATE-20 03/07/2022   Rotavirus,unspecified  06/09/2022   Td 10/30/2017   Tdap 09/21/2007    Health Maintenance  Topic Date Due   Lung Cancer Screening  Never done   Zoster Vaccines- Shingrix (1 of 2) Never done   COVID-19 Vaccine (4 - 2023-24 season) 02/22/2023   INFLUENZA VACCINE  09/21/2023 (Originally 01/22/2023)   DTaP/Tdap/Td (3 - Td or Tdap) 10/31/2027   Colonoscopy  04/08/2032   Pneumonia Vaccine 97+ Years old  Completed   Hepatitis C Screening  Completed   HPV VACCINES  Aged Out    Discussed health benefits of physical activity, and encouraged him to engage in regular exercise appropriate for his age and condition.     Routine annual physical -Continue efforts to reduce weight through diet and increased physical activity as tolerated. -Check A1c, PSA, renal and cholesterol levels today.   Chronic Back Pain Persistent despite previous interventions. MRI in February showed herniated discs. Pain is limiting physical activity and affecting quality of life. -Start Meloxicam once daily for anti-inflammatory effect and pain  relief. -Referral to [facility] for further evaluation and treatment options.  Hypertension Blood pressure slightly elevated today, possibly due to patient not taking medication prior to visit. -Continue current antihypertensive regimen. -Advise patient to take blood pressure medication as usual prior to future appointments.   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 -Check A1c         Mila Merry, MD  [facility] [phone] (phone) [phone] (fax)  [facility]

## 2023-03-18 LAB — CBC
Hematocrit: 42.9 % (ref 37.5–51.0)
Hemoglobin: 13.9 g/dL (ref 13.0–17.7)
MCH: 28.1 pg (ref 26.6–33.0)
MCHC: 32.4 g/dL (ref 31.5–35.7)
MCV: 87 fL (ref 79–97)
Platelets: 230 10*3/uL (ref 150–450)
RBC: 4.94 x10E6/uL (ref 4.14–5.80)
RDW: 13.6 % (ref 11.6–15.4)
WBC: 9.5 10*3/uL (ref 3.4–10.8)

## 2023-03-18 LAB — COMPREHENSIVE METABOLIC PANEL
ALT: 14 IU/L (ref 0–44)
AST: 18 IU/L (ref 0–40)
Albumin: 4.5 g/dL (ref 3.9–4.9)
Alkaline Phosphatase: 93 IU/L (ref 44–121)
BUN/Creatinine Ratio: 15 (ref 10–24)
BUN: 18 mg/dL (ref 8–27)
Bilirubin Total: 0.5 mg/dL (ref 0.0–1.2)
CO2: 27 mmol/L (ref 20–29)
Calcium: 9.9 mg/dL (ref 8.6–10.2)
Chloride: 101 mmol/L (ref 96–106)
Creatinine, Ser: 1.24 mg/dL (ref 0.76–1.27)
Globulin, Total: 2.3 g/dL (ref 1.5–4.5)
Glucose: 112 mg/dL — ABNORMAL HIGH (ref 70–99)
Potassium: 4 mmol/L (ref 3.5–5.2)
Sodium: 139 mmol/L (ref 134–144)
Total Protein: 6.8 g/dL (ref 6.0–8.5)
eGFR: 64 mL/min/{1.73_m2} (ref >59)

## 2023-03-18 LAB — LIPID PANEL
Chol/HDL Ratio: 4 ratio (ref 0.0–5.0)
Cholesterol, Total: 190 mg/dL (ref 100–199)
HDL: 48 mg/dL (ref >39)
LDL Chol Calc (NIH): 113 mg/dL — ABNORMAL HIGH (ref 0–99)
Triglycerides: 168 mg/dL — ABNORMAL HIGH (ref 0–149)
VLDL Cholesterol Cal: 29 mg/dL (ref 5–40)

## 2023-03-18 LAB — HEMOGLOBIN A1C
Est. average glucose Bld gHb Est-mCnc: 128 mg/dL
Hgb A1c MFr Bld: 6.1 % — ABNORMAL HIGH (ref 4.8–5.6)

## 2023-03-18 LAB — PSA TOTAL (REFLEX TO FREE): Prostate Specific Ag, Serum: 0.4 ng/mL (ref 0.0–4.0)

## 2023-03-24 ENCOUNTER — Other Ambulatory Visit: Payer: Self-pay | Admitting: Family Medicine

## 2023-03-24 DIAGNOSIS — K219 Gastro-esophageal reflux disease without esophagitis: Secondary | ICD-10-CM

## 2023-03-25 NOTE — Telephone Encounter (Signed)
Requested Prescriptions  Pending Prescriptions Disp Refills   omeprazole (PRILOSEC) 40 MG capsule [Pharmacy Med Name: OMEPRAZOLE 40MG  CAPSULES] 90 capsule 0    Sig: TAKE 1 CAPSULE BY MOUTH DAILY     Gastroenterology: Proton Pump Inhibitors Passed - 03/24/2023  4:45 PM      Passed - Valid encounter within last 12 months    Recent Outpatient Visits           1 week ago Annual physical exam   [facility] Malva Limes, MD   6 months ago Prediabetes   [facility] Malva Limes, MD   1 year ago Annual physical exam   [facility] Malva Limes, MD   1 year ago Hyperlipidemia, unspecified hyperlipidemia type   [facility] Malva Limes, MD   2 years ago Annual physical exam   [facility] Malva Limes, MD       Future Appointments             In 4 months McGowan, Elana Alm [facility] [place]   In 5 months Fisher, Demetrios Isaacs, MD [facility]

## 2023-04-17 ENCOUNTER — Other Ambulatory Visit: Payer: Self-pay | Admitting: Family Medicine

## 2023-05-13 ENCOUNTER — Other Ambulatory Visit: Payer: Self-pay | Admitting: Urology

## 2023-05-13 DIAGNOSIS — N529 Male erectile dysfunction, unspecified: Secondary | ICD-10-CM

## 2023-08-07 ENCOUNTER — Other Ambulatory Visit: Payer: Self-pay

## 2023-08-11 NOTE — Progress Notes (Deleted)
 08/13/2023 10:12 PM   Keith Villegas 12-24-1955 [id number]  Referring provider: Malva Limes, MD [address] [place],  [place] [address]  Urological history 1. ED -contributing factors of age, HTN, HLD, obesity and DM -tadalafil 5 mg daily  2. BPH with LU TS -PSA (02/2023) 0.4  No chief complaint on file.   HPI: Keith Villegas is a 68 y.o. male who presents today to for yearly follow up.   Previous records reviewed.     I PSS ***    Score:  1-7 Mild 8-19 Moderate 20-35 Severe   SHIM ***    Score: 1-7 Severe ED 8-11 Moderate ED 12-16 Mild-Moderate ED 17-21 Mild ED 22-25 No ED   PMH: Past Medical History:  Diagnosis Date   Adenomatous polyp of colon    Costochondritis 09/12/2015   GERD (gastroesophageal reflux disease)    Hypertension     Surgical History: Past Surgical History:  Procedure Laterality Date   APPENDECTOMY  1976   COLONOSCOPY WITH PROPOFOL N/A 11/20/2016   Procedure: COLONOSCOPY WITH PROPOFOL;  Surgeon: Wyline Mood, MD;  Location: [facility] ENDOSCOPY;  Service: Endoscopy;  Laterality: N/A;   COLONOSCOPY WITH PROPOFOL N/A 04/08/2022   Procedure: COLONOSCOPY WITH PROPOFOL;  Surgeon: Wyline Mood, MD;  Location: [facility] ENDOSCOPY;  Service: Gastroenterology;  Laterality: N/A;   echocardiogram  10/02/2010   Myocardial perfusion scan  10/02/2010   normal perfusion. Normal systolic function. Borderline right ventricular dilation. EF>65%   TOE SURGERY  2005    Home Medications:  Allergies as of 08/13/2023   No Known Allergies      Medication List        Accurate as of August 11, 2023 10:12 PM. If you have any questions, ask your nurse or doctor.          amLODipine 2.5 MG tablet Commonly known as: NORVASC TAKE 1 TABLET(2.5 MG) BY MOUTH DAILY   aspirin 81 MG tablet Take 81 mg by mouth daily.   gabapentin 100 MG capsule Commonly known as: NEURONTIN Take 1 capsule (100 mg total) by mouth 3 (three) times daily.    gabapentin 300 MG capsule Commonly known as: NEURONTIN TAKE 2 CAPSULES BY MOUTH THREE TIMES DAILY   hydrocortisone-pramoxine 2.5-1 % rectal cream Commonly known as: Analpram HC Place 1 application rectally 3 (three) times daily.   lisinopril-hydrochlorothiazide 20-25 MG tablet Commonly known as: ZESTORETIC TAKE 1 TABLET BY MOUTH EVERY DAY   meloxicam 15 MG tablet Commonly known as: MOBIC Take 1 tablet (15 mg total) by mouth daily.   methocarbamol 500 MG tablet Commonly known as: ROBAXIN Take 500 mg by mouth every 6 (six) hours as needed for muscle spasms.   metoprolol succinate 50 MG 24 hr tablet Commonly known as: TOPROL-XL TAKE 1 TABLET BY MOUTH EVERY DAY WITH OR IMMEDIATELY FOLLOWING A MEAL   multivitamin capsule Take 1 capsule by mouth daily.   neomycin-polymyxin-hydrocortisone OTIC solution Commonly known as: CORTISPORIN 3-4 drops both ears as neeeded   omeprazole 40 MG capsule Commonly known as: PRILOSEC TAKE 1 CAPSULE BY MOUTH DAILY   rosuvastatin 20 MG tablet Commonly known as: CRESTOR Take 1 tablet (20 mg total) by mouth daily.   tadalafil 5 MG tablet Commonly known as: CIALIS TAKE 1 TO 2 TABLETS BY MOUTH EVERY DAY        Allergies: No Known Allergies  Family History: Family History  Problem Relation Age of Onset   Cancer Mother  Dementia & Schizophrenia   CAD Father    Colon cancer Neg Hx    Prostate cancer Neg Hx     Social History:  reports that he has quit smoking. He has never used smokeless tobacco. He reports that he does not drink alcohol and does not use drugs.  ROS: Pertinent ROS in HPI  Physical Exam: There were no vitals taken for this visit.  Constitutional:  Well nourished. Alert and oriented, No acute distress. HEENT: [place] AT, moist mucus membranes.  Trachea midline, no masses. Cardiovascular: No clubbing, cyanosis, or edema. Respiratory: Normal respiratory effort, no increased work of breathing. GI: Abdomen is soft,  non tender, non distended, no abdominal masses. Liver and spleen not palpable.  No hernias appreciated.  Stool sample for occult testing is not indicated.   GU: No CVA tenderness.  No bladder fullness or masses.  Patient with circumcised/uncircumcised phallus. ***Foreskin easily retracted***  Urethral meatus is patent.  No penile discharge. No penile lesions or rashes. Scrotum without lesions, cysts, rashes and/or edema.  Testicles are located scrotally bilaterally. No masses are appreciated in the testicles. Left and right epididymis are normal. Rectal: Patient with  normal sphincter tone. Anus and perineum without scarring or rashes. No rectal masses are appreciated. Prostate is approximately *** grams, *** nodules are appreciated. Seminal vesicles are normal. Skin: No rashes, bruises or suspicious lesions. Lymph: No cervical or inguinal adenopathy. Neurologic: Grossly intact, no focal deficits, moving all 4 extremities. Psychiatric: Normal mood and affect.   Laboratory Data: Component     Latest Ref Rng 03/17/2023  Hemoglobin A1C     4.8 - 5.6 % 6.1 (H)   Est. average glucose Bld gHb Est-mCnc     mg/dL 213     Legend: (H) High  Lipid Panel     Component Value Date/Time   CHOL 190 03/17/2023 1003   TRIG 168 (H) 03/17/2023 1003   HDL 48 03/17/2023 1003   CHOLHDL 4.0 03/17/2023 1003   LDLCALC 113 (H) 03/17/2023 1003   LABVLDL 29 03/17/2023 1003    CMP     Component Value Date/Time   NA 139 03/17/2023 1003   NA 139 07/26/2012 0918   K 4.0 03/17/2023 1003   K 4.1 07/26/2012 0918   CL 101 03/17/2023 1003   CL 105 07/26/2012 0918   CO2 27 03/17/2023 1003   CO2 31 07/26/2012 0918   GLUCOSE 112 (H) 03/17/2023 1003   GLUCOSE 107 (H) 07/26/2012 0918   BUN 18 03/17/2023 1003   BUN 13 07/26/2012 0918   CREATININE 1.24 03/17/2023 1003   CREATININE 1.12 07/26/2012 0918   CALCIUM 9.9 03/17/2023 1003   CALCIUM 9.4 07/26/2012 0918   PROT 6.8 03/17/2023 1003   PROT 8.0 07/26/2012 0918    ALBUMIN 4.5 03/17/2023 1003   ALBUMIN 4.2 07/26/2012 0918   AST 18 03/17/2023 1003   AST 25 07/26/2012 0918   ALT 14 03/17/2023 1003   ALT 30 07/26/2012 0918   ALKPHOS 93 03/17/2023 1003   ALKPHOS 105 07/26/2012 0918   BILITOT 0.5 03/17/2023 1003   BILITOT 0.4 07/26/2012 0918   EGFR 64 03/17/2023 1003   GFRNONAA 69 04/09/2020 1011   GFRNONAA >60 07/26/2012 0918    CBC    Component Value Date/Time   WBC 9.5 03/17/2023 1003   WBC 8.3 07/26/2012 0918   RBC 4.94 03/17/2023 1003   RBC 5.81 07/26/2012 0918   HGB 13.9 03/17/2023 1003   HCT 42.9 03/17/2023 1003  PLT 230 03/17/2023 1003   MCV 87 03/17/2023 1003   MCV 81 07/26/2012 0918   MCH 28.1 03/17/2023 1003   MCH 27.9 07/26/2012 0918   MCHC 32.4 03/17/2023 1003   MCHC 34.4 07/26/2012 0918   RDW 13.6 03/17/2023 1003   RDW 13.4 07/26/2012 0918   LYMPHSABS 2.1 04/09/2020 1011   LYMPHSABS 2.0 07/26/2012 0918   MONOABS 0.4 07/26/2012 0918   EOSABS 0.4 04/09/2020 1011   EOSABS 0.4 07/26/2012 0918   BASOSABS 0.1 04/09/2020 1011   BASOSABS 0.1 07/26/2012 0918     Component     Latest Ref Rng 03/17/2023  Prostate Specific Ag, Serum     0.0 - 4.0 ng/mL 0.4   Reflex Criteria Comment   I have reviewed the labs.  See HPI.     Pertinent Imaging N/A    Assessment & Plan:    1. BPH with LUTS -PSA stable *** -DRE benign *** -UA benign *** -PVR < 300 cc *** -symptoms - *** -most bothersome symptoms are *** -continue conservative management, avoiding bladder irritants and timed voiding's -Initiate alpha-blocker (***), discussed side effects *** -Initiate 5 alpha reductase inhibitor (***), discussed side effects *** -Continue tamsulosin 0.4 mg daily, alfuzosin 10 mg daily, Rapaflo 8 mg daily, terazosin, doxazosin, Cialis 5 mg daily and finasteride 5 mg daily, dutasteride 0.5 mg daily***:refills given -Cannot tolerate medication or medication failure, schedule cystoscopy ***  2. Erectile dysfunction ***   No  follow-ups on file.  These notes generated with voice recognition software. I apologize for typographical errors.  Cloretta Ned  [facility] [address]  [address] [place], [place] [address] [phone]

## 2023-08-13 ENCOUNTER — Ambulatory Visit: Payer: Self-pay | Admitting: Urology

## 2023-08-13 ENCOUNTER — Encounter: Payer: Self-pay | Admitting: Urology

## 2023-08-13 DIAGNOSIS — N529 Male erectile dysfunction, unspecified: Secondary | ICD-10-CM

## 2023-08-13 DIAGNOSIS — N138 Other obstructive and reflux uropathy: Secondary | ICD-10-CM

## 2023-09-15 ENCOUNTER — Encounter: Payer: Self-pay | Admitting: Family Medicine

## 2023-09-15 ENCOUNTER — Ambulatory Visit: Payer: BC Managed Care – PPO | Admitting: Family Medicine

## 2023-09-15 VITALS — BP 151/65 | HR 77 | Ht 71.0 in | Wt 245.0 lb

## 2023-09-15 DIAGNOSIS — E785 Hyperlipidemia, unspecified: Secondary | ICD-10-CM

## 2023-09-15 DIAGNOSIS — I10 Essential (primary) hypertension: Secondary | ICD-10-CM | POA: Diagnosis not present

## 2023-09-15 DIAGNOSIS — R7303 Prediabetes: Secondary | ICD-10-CM

## 2023-09-15 MED ORDER — AMLODIPINE BESYLATE 5 MG PO TABS
5.0000 mg | ORAL_TABLET | Freq: Every day | ORAL | 1 refills | Status: DC
Start: 2023-09-15 — End: 2024-02-25

## 2023-09-15 NOTE — Progress Notes (Signed)
 Established patient visit   Patient: Keith Villegas   DOB: 1955-07-10   68 y.o. Male  MRN: [id number] Visit Date: 09/15/2023  Today's healthcare provider: Mila Merry, MD   Chief Complaint  Patient presents with   Follow-up    No questions no concerns    Subjective    HPI Follow up hypertension, lipids, prediabetes and back pain. Reports he is taking his medications consistently, although amlodipine does not show on dispense history reports since June 2024. Has cut back on sugar in diet. Exercise limited by back pain which is otherwise stable. Usually does better during warmer weather and anticipates getting back in to walking routine in the next few months.  Lab Results  Component Value Date   HGBA1C 6.1 (H) 03/17/2023   HGBA1C 5.9 (A) 09/05/2022   HGBA1C 6.0 (H) 03/07/2022   Lab Results  Component Value Date   NA 139 03/17/2023   K 4.0 03/17/2023   CREATININE 1.24 03/17/2023   EGFR 64 03/17/2023   GLUCOSE 112 (H) 03/17/2023   Lab Results  Component Value Date   CHOL 190 03/17/2023   HDL 48 03/17/2023   LDLCALC 113 (H) 03/17/2023   TRIG 168 (H) 03/17/2023   CHOLHDL 4.0 03/17/2023      Medications: Outpatient Medications Prior to Visit  Medication Sig   aspirin 81 MG tablet Take 81 mg by mouth daily.    gabapentin (NEURONTIN) 300 MG capsule TAKE 2 CAPSULES BY MOUTH THREE TIMES DAILY   hydrocortisone-pramoxine (ANALPRAM HC) 2.5-1 % rectal cream Place 1 application rectally 3 (three) times daily.   ibuprofen (ADVIL) 800 MG tablet Take 800 mg by mouth every 6 (six) hours as needed.   lisinopril-hydrochlorothiazide (ZESTORETIC) 20-25 MG tablet TAKE 1 TABLET BY MOUTH EVERY DAY   methocarbamol (ROBAXIN) 500 MG tablet Take 500 mg by mouth every 6 (six) hours as needed for muscle spasms.   metoprolol succinate (TOPROL-XL) 50 MG 24 hr tablet TAKE 1 TABLET BY MOUTH EVERY DAY WITH OR IMMEDIATELY FOLLOWING A MEAL   Multiple Vitamin (MULTIVITAMIN) capsule Take 1 capsule  by mouth daily.   neomycin-polymyxin-hydrocortisone (CORTISPORIN) OTIC solution 3-4 drops both ears as neeeded   omeprazole (PRILOSEC) 40 MG capsule TAKE 1 CAPSULE BY MOUTH DAILY   rosuvastatin (CRESTOR) 20 MG tablet Take 1 tablet (20 mg total) by mouth daily.   tadalafil (CIALIS) 5 MG tablet TAKE 1 TO 2 TABLETS BY MOUTH EVERY DAY   [DISCONTINUED] amLODipine (NORVASC) 2.5 MG tablet TAKE 1 TABLET(2.5 MG) BY MOUTH DAILY   gabapentin (NEURONTIN) 100 MG capsule Take 1 capsule (100 mg total) by mouth 3 (three) times daily. (Patient not taking: Reported on 09/15/2023)   meloxicam (MOBIC) 15 MG tablet Take 1 tablet (15 mg total) by mouth daily. (Patient not taking: Reported on 09/15/2023)   No facility-administered medications prior to visit.    Review of Systems  Constitutional:  Negative for appetite change, chills and fever.  Respiratory:  Negative for chest tightness, shortness of breath and wheezing.   Cardiovascular:  Negative for chest pain and palpitations.  Gastrointestinal:  Negative for abdominal pain, nausea and vomiting.       Objective    BP (!) 151/65   Pulse 77   Ht 5\' 11"  (1.803 m)   Wt 245 lb (111.1 kg)   SpO2 99%   BMI 34.17 kg/m    Physical Exam   General appearance: Mildly obese male, cooperative and in no acute distress Head: Normocephalic,  without obvious abnormality, atraumatic Respiratory: Respirations even and unlabored, normal respiratory rate Extremities: All extremities are intact.  Skin: Skin color, texture, turgor normal. No rashes seen  Psych: Appropriate mood and affect. Neurologic: Mental status: Alert, oriented to person, place, and time, thought content appropriate.   Assessment & Plan     1. Primary hypertension (Primary) Not at goal, didn't bring meds with him today and amlopine 2.5 not showing on dispense history report, unclear is he is still taking that. Continue other meds. Prescription amLODipine (NORVASC) 5 MG tablet; Take 1 tablet (5 mg  total) by mouth daily.  Dispense: 90 tablet; Refill: 1  2. Hyperlipidemia, unspecified hyperlipidemia type He is tolerating rosuvastatin well with no adverse effects.   - Lipid panel  3. Prediabetes  - Hemoglobin A1c  4. Chronic back pain Stable, expect to be able to exercise more with warmer weather the next couple of months.   Return in about 6 months (around 03/17/2024) for Yearly Physical.         Mila Merry, MD  [facility] [phone] (phone) [phone] (fax)  [facility]

## 2023-09-15 NOTE — Patient Instructions (Signed)
 Keith Villegas  Please review the attached list of medications and notify my office if there are any errors.   . Please bring all of your medications to every appointment so we can make sure that our medication list is the same as yours.

## 2023-09-16 ENCOUNTER — Encounter: Payer: Self-pay | Admitting: Family Medicine

## 2023-09-16 ENCOUNTER — Telehealth: Payer: Self-pay

## 2023-09-16 LAB — LIPID PANEL
Chol/HDL Ratio: 3.9 ratio (ref 0.0–5.0)
Cholesterol, Total: 202 mg/dL — ABNORMAL HIGH (ref 100–199)
HDL: 52 mg/dL (ref >39)
LDL Chol Calc (NIH): 120 mg/dL — ABNORMAL HIGH (ref 0–99)
Triglycerides: 171 mg/dL — ABNORMAL HIGH (ref 0–149)
VLDL Cholesterol Cal: 30 mg/dL (ref 5–40)

## 2023-09-16 LAB — HEMOGLOBIN A1C
Est. average glucose Bld gHb Est-mCnc: 128 mg/dL
Hgb A1c MFr Bld: 6.1 % — ABNORMAL HIGH (ref 4.8–5.6)

## 2023-09-16 NOTE — Telephone Encounter (Unsigned)
 Copied from CRM [id number]. Topic: Clinical - Medical Advice >> Sep 16, 2023  1:43 PM Keith Villegas S wrote: Reason for CRM: Patient would like to know if he needs to receive the shingles vaccine. Please contact patient with instructions.

## 2023-09-17 NOTE — Telephone Encounter (Signed)
 Yes I recommend that he get the shingles vaccine.

## 2023-09-18 NOTE — Telephone Encounter (Signed)
 Patient advised.

## 2023-09-30 ENCOUNTER — Telehealth: Payer: Self-pay

## 2023-09-30 NOTE — Telephone Encounter (Unsigned)
 Copied from CRM [id number]. Topic: Clinical - Medication Question >> Sep 30, 2023 10:35 AM Shelah Lewandowsky wrote: Reason for CRM: patient question, does he still need to take rosuvastatin (CRESTOR) 20 MG tablet, if he does, he is out of this medication. He also takes amLODipine (NORVASC) 5 MG tablet and needs to know if he is supposed to take these together, [phone]  have not started the amlodipine yet

## 2023-10-01 ENCOUNTER — Other Ambulatory Visit: Payer: Self-pay

## 2023-10-01 DIAGNOSIS — E785 Hyperlipidemia, unspecified: Secondary | ICD-10-CM

## 2023-10-01 MED ORDER — ROSUVASTATIN CALCIUM 20 MG PO TABS: 20.0000 mg | ORAL_TABLET | Freq: Every day | ORAL | 4 refills | Status: AC

## 2023-10-01 NOTE — Telephone Encounter (Signed)
Patient advised and prescription sent in.

## 2023-10-01 NOTE — Telephone Encounter (Signed)
 Yes, he needs to continue both the rosuvastatin and the amlodipine

## 2023-11-20 ENCOUNTER — Other Ambulatory Visit: Payer: Self-pay | Admitting: Family Medicine

## 2023-11-20 DIAGNOSIS — I10 Essential (primary) hypertension: Secondary | ICD-10-CM

## 2023-12-07 ENCOUNTER — Other Ambulatory Visit: Payer: Self-pay | Admitting: Family Medicine

## 2023-12-07 DIAGNOSIS — I10 Essential (primary) hypertension: Secondary | ICD-10-CM

## 2023-12-25 NOTE — Progress Notes (Unsigned)
 12/28/2023 6:20 AM   Keith Villegas Keith Villegas 03, 1957 [id number]  Referring provider: Gasper Keith FORBES, MD [address] [place],  [place] [address]  Urological history: 1. ED -testosterone  level pending   2. BPH with LU TS -PSA (02/2023) 0.4   HPI: Keith Villegas is a 68 y.o. male who presents today for medication refill.   Previous records reviewed.     Main complaint: *** x *** years Risk factors:  age, BPH, hypogonadism, HTN, HLD and history of smoking No *** painful erections or cuvatures with his erections.    Still having/no longer having spontaneous erections. *** Tried:        Major complaint(s):  x *** years. Denies any dysuria, hematuria or suprapubic pain.   He has had ***.   Denies any recent fevers, chills, nausea or vomiting.  He has a family history of PCa, colon cancer, ovarian cancer and/or breast cancer with ***.   He does not have a family history of PCa, colon cancer, ovarian cancer, and/or breast cancer .***     Serum creatinine (02/2023) 1.24, eGFR 64  PSA (02/2023) 0.4   Hbg A1c (08/2023) 6.1     PMH: Past Medical History:  Diagnosis Date   Adenomatous polyp of colon    Costochondritis 09/12/2015   GERD (gastroesophageal reflux disease)    Hypertension     Surgical History: Past Surgical History:  Procedure Laterality Date   APPENDECTOMY  1976   COLONOSCOPY WITH PROPOFOL  N/A 11/20/2016   Procedure: COLONOSCOPY WITH PROPOFOL ;  Surgeon: Therisa Bi, MD;  Location: [facility] ENDOSCOPY;  Service: Endoscopy;  Laterality: N/A;   COLONOSCOPY WITH PROPOFOL  N/A 04/08/2022   Procedure: COLONOSCOPY WITH PROPOFOL ;  Surgeon: Therisa Bi, MD;  Location: [facility] ENDOSCOPY;  Service: Gastroenterology;  Laterality: N/A;   echocardiogram  10/02/2010   Myocardial perfusion scan  10/02/2010   normal perfusion. Normal systolic function. Borderline right ventricular dilation. EF>65%   TOE SURGERY  2005    Home Medications:  Allergies as of 12/28/2023    No Known Allergies      Medication List        Accurate as of December 25, 2023  6:20 AM. If you have any questions, ask your nurse or doctor.          amLODipine  5 MG tablet Commonly known as: NORVASC  Take 1 tablet (5 mg total) by mouth daily.   aspirin 81 MG tablet Take 81 mg by mouth daily.   gabapentin  100 MG capsule Commonly known as: NEURONTIN  Take 1 capsule (100 mg total) by mouth 3 (three) times daily.   gabapentin  300 MG capsule Commonly known as: NEURONTIN  TAKE 2 CAPSULES BY MOUTH THREE TIMES DAILY   hydrocortisone -pramoxine 2.5-1 % rectal cream Commonly known as: Analpram  HC Place 1 application rectally 3 (three) times daily.   ibuprofen 800 MG tablet Commonly known as: ADVIL Take 800 mg by mouth every 6 (six) hours as needed.   lisinopril -hydrochlorothiazide  20-25 MG tablet Commonly known as: ZESTORETIC  TAKE 1 TABLET BY MOUTH EVERY DAY   meloxicam  15 MG tablet Commonly known as: MOBIC  Take 1 tablet (15 mg total) by mouth daily.   methocarbamol  500 MG tablet Commonly known as: ROBAXIN  Take 500 mg by mouth every 6 (six) hours as needed for muscle spasms.   metoprolol  succinate 50 MG 24 hr tablet Commonly known as: TOPROL -XL TAKE 1 TABLET BY MOUTH EVERY DAY WITH OR IMMEDIATELY FOLLOWING A MEAL   multivitamin capsule Take 1 capsule by  mouth daily.   neomycin -polymyxin-hydrocortisone  OTIC solution Commonly known as: CORTISPORIN 3-4 drops both ears as neeeded   omeprazole  40 MG capsule Commonly known as: PRILOSEC TAKE 1 CAPSULE BY MOUTH DAILY   rosuvastatin  20 MG tablet Commonly known as: CRESTOR  Take 1 tablet (20 mg total) by mouth daily.   tadalafil  5 MG tablet Commonly known as: CIALIS  TAKE 1 TO 2 TABLETS BY MOUTH EVERY DAY        Allergies: No Known Allergies  Family History: Family History  Problem Relation Age of Onset   Cancer Mother        Dementia & Schizophrenia   CAD Father    Colon cancer Neg Hx    Prostate cancer  Neg Hx     Social History:  reports that he has quit smoking. He has never used smokeless tobacco. He reports that he does not drink alcohol and does not use drugs.  ROS: Pertinent ROS in HPI  Physical Exam: There were no vitals taken for this visit.  Constitutional:  Well nourished. Alert and oriented, No acute distress. HEENT: [place] AT, moist mucus membranes.  Trachea midline, no masses. Cardiovascular: No clubbing, cyanosis, or edema. Respiratory: Normal respiratory effort, no increased work of breathing. GI: Abdomen is soft, non tender, non distended, no abdominal masses. Liver and spleen not palpable.  No hernias appreciated.  Stool sample for occult testing is not indicated.   GU: No CVA tenderness.  No bladder fullness or masses.  Patient with circumcised/uncircumcised phallus. ***Foreskin easily retracted***  Urethral meatus is patent.  No penile discharge. No penile lesions or rashes. Scrotum without lesions, cysts, rashes and/or edema.  Testicles are located scrotally bilaterally. No masses are appreciated in the testicles. Left and right epididymis are normal. Neurologic: Grossly intact, no focal deficits, moving all 4 extremities. Psychiatric: Normal mood and affect.   Laboratory Data: See EPIC and HPI I have reviewed the labs.  Pertinent Imaging N/A   Assessment & Plan:    1. Hypogonadism - testosterone  pending   2. ED - ***     No follow-ups on file.  These notes generated with voice recognition software. I apologize for typographical errors.  Keith Villegas  [facility] [address]  [address] [place], [place] [address] [phone]  I spent 15 minutes on the day of the encounter to include pre-visit record review, face-to-face time with the patient, and post-visit ordering of tests.

## 2023-12-28 ENCOUNTER — Ambulatory Visit: Admitting: Urology

## 2023-12-28 ENCOUNTER — Encounter: Payer: Self-pay | Admitting: Urology

## 2023-12-28 VITALS — BP 149/86 | HR 74 | Ht 71.0 in | Wt 243.4 lb

## 2023-12-28 DIAGNOSIS — N529 Male erectile dysfunction, unspecified: Secondary | ICD-10-CM | POA: Diagnosis not present

## 2023-12-28 DIAGNOSIS — Z720 Tobacco use: Secondary | ICD-10-CM | POA: Diagnosis not present

## 2023-12-28 DIAGNOSIS — E291 Testicular hypofunction: Secondary | ICD-10-CM | POA: Diagnosis not present

## 2023-12-28 MED ORDER — TADALAFIL 5 MG PO TABS: ORAL_TABLET | ORAL | 0 refills | Status: DC

## 2023-12-29 ENCOUNTER — Ambulatory Visit: Payer: Self-pay | Admitting: Urology

## 2023-12-29 LAB — TESTOSTERONE: Testosterone: 214 ng/dL — ABNORMAL LOW (ref 264–916)

## 2024-02-04 ENCOUNTER — Other Ambulatory Visit: Payer: Self-pay

## 2024-02-04 DIAGNOSIS — E291 Testicular hypofunction: Secondary | ICD-10-CM

## 2024-02-05 ENCOUNTER — Other Ambulatory Visit

## 2024-02-25 ENCOUNTER — Other Ambulatory Visit: Payer: Self-pay | Admitting: Family Medicine

## 2024-02-25 DIAGNOSIS — I10 Essential (primary) hypertension: Secondary | ICD-10-CM

## 2024-03-18 ENCOUNTER — Encounter: Admitting: Family Medicine

## 2024-04-13 ENCOUNTER — Other Ambulatory Visit: Payer: Self-pay | Admitting: Family Medicine

## 2024-04-27 ENCOUNTER — Ambulatory Visit: Admitting: Family Medicine

## 2024-04-27 ENCOUNTER — Encounter: Payer: Self-pay | Admitting: Family Medicine

## 2024-04-27 VITALS — BP 153/76 | HR 66 | Ht 71.0 in | Wt 241.7 lb

## 2024-04-27 DIAGNOSIS — M48061 Spinal stenosis, lumbar region without neurogenic claudication: Secondary | ICD-10-CM

## 2024-04-27 DIAGNOSIS — M25562 Pain in left knee: Secondary | ICD-10-CM

## 2024-04-27 DIAGNOSIS — K219 Gastro-esophageal reflux disease without esophagitis: Secondary | ICD-10-CM

## 2024-04-27 DIAGNOSIS — I10 Essential (primary) hypertension: Secondary | ICD-10-CM | POA: Diagnosis not present

## 2024-04-27 DIAGNOSIS — Z125 Encounter for screening for malignant neoplasm of prostate: Secondary | ICD-10-CM

## 2024-04-27 DIAGNOSIS — Z0001 Encounter for general adult medical examination with abnormal findings: Secondary | ICD-10-CM | POA: Diagnosis not present

## 2024-04-27 DIAGNOSIS — E291 Testicular hypofunction: Secondary | ICD-10-CM | POA: Diagnosis not present

## 2024-04-27 DIAGNOSIS — E785 Hyperlipidemia, unspecified: Secondary | ICD-10-CM

## 2024-04-27 DIAGNOSIS — Z0000 Encounter for general adult medical examination without abnormal findings: Secondary | ICD-10-CM

## 2024-04-27 DIAGNOSIS — M545 Low back pain, unspecified: Secondary | ICD-10-CM

## 2024-04-27 DIAGNOSIS — G8929 Other chronic pain: Secondary | ICD-10-CM

## 2024-04-27 DIAGNOSIS — R7303 Prediabetes: Secondary | ICD-10-CM | POA: Diagnosis not present

## 2024-04-27 NOTE — Progress Notes (Addendum)
 Complete physical exam   Patient: Keith Villegas   DOB: Sep 13, 1955   68 y.o. Male  MRN: [id number] Visit Date: 04/27/2024  Today's healthcare provider: Nancyann Perry, MD   Chief Complaint  Patient presents with   Annual Exam    Last completed 03/17/23 Diet - Well balanced  Exercise - none due to knee Feeling - well Sleeping - well  Concerns - None    Care Management    Declined lung cancer screening   Subjective    Discussed the use of AI scribe software for clinical note transcription with the patient, who gave verbal consent to proceed.  History of Present Illness   Keith Villegas is a 68 year old male who presents for an annual physical exam. He is accompanied by his wife.  He has significant issues with his left knee and back. He has not seen an orthopedist recently but is seeking a referral for his knee. He previously saw an orthopedist in [place], where he had X-rays and was informed that all ligaments except the main one were torn, and he has bone spurs. He did not undergo surgery. The left knee is described as worse than the right.  He received a flu shot at [employer] and a COVID shot at [employer].   He has a history of smoking but quit many years ago. He occasionally uses other substances to relax his knee and body, which helps him rest. He acknowledges the potential lung damage from frequent use but states it is infrequent.  He did not take his blood pressure medication today as he prefers to take it with food to avoid feeling sick. He last checked his blood pressure two weeks ago at a dentist's office, where it was 134/72. He has a blood pressure cuff at home.  His testosterone  levels were checked a few months ago and were low. He was prescribed tadalafil  5 mg. He missed a follow-up appointment due to taking someone to the doctor. He is open to rechecking his testosterone  levels.  No stomach pain, cramping, or changes in bowel habits.     Lab Results   Component Value Date   TESTOSTERONE  214 (L) 12/28/2023       Past Medical History:  Diagnosis Date   Adenomatous polyp of colon    Costochondritis 09/12/2015   GERD (gastroesophageal reflux disease)    Hypertension    Past Surgical History:  Procedure Laterality Date   APPENDECTOMY  1976   COLONOSCOPY WITH PROPOFOL  N/A 11/20/2016   Procedure: COLONOSCOPY WITH PROPOFOL ;  Surgeon: Therisa Bi, MD;  Location: [facility] ENDOSCOPY;  Service: Endoscopy;  Laterality: N/A;   COLONOSCOPY WITH PROPOFOL  N/A 04/08/2022   Procedure: COLONOSCOPY WITH PROPOFOL ;  Surgeon: Therisa Bi, MD;  Location: [facility] ENDOSCOPY;  Service: Gastroenterology;  Laterality: N/A;   echocardiogram  10/02/2010   Myocardial perfusion scan  10/02/2010   normal perfusion. Normal systolic function. Borderline right ventricular dilation. EF>65%   TOE SURGERY  2005   Social History   Socioeconomic History   Marital status: Married    Spouse name: Not on file   Number of children: 2   Years of education: Not on file   Highest education level: Not on file  Occupational History   Occupation: Tourist Information Centre Manager    Comment: works at [employer]  Tobacco Use   Smoking status: Former    Current packs/day: 0.00    Types: Cigarettes    Quit date: 1985  Years since quitting: 40.8   Smokeless tobacco: Never  Vaping Use   Vaping status: Never Used  Substance and Sexual Activity   Alcohol use: No    Alcohol/week: 0.0 standard drinks of alcohol   Drug use: No   Sexual activity: Not Currently  Other Topics Concern   Not on file  Social History Narrative   Not on file   Social Drivers of Health   Financial Resource Strain: Low Risk  (09/15/2023)   Overall Financial Resource Strain (CARDIA)    Difficulty of Paying Living Expenses: Not hard at all  Food Insecurity: No Food Insecurity (09/15/2023)   Hunger Vital Sign    Worried About Running Out of Food in the Last Year: Never true    Ran Out of Food in the Last  Year: Never true  Transportation Needs: No Transportation Needs (09/15/2023)   PRAPARE - Administrator, Civil Service (Medical): No    Lack of Transportation (Non-Medical): No  Physical Activity: Not on file  Stress: No Stress Concern Present (09/15/2023)   [employer] of Occupational Health - Occupational Stress Questionnaire    Feeling of Stress : Not at all  Social Connections: Moderately Integrated (04/27/2024)   Social Connection and Isolation Panel    Frequency of Communication with Friends and Family: More than three times a week    Frequency of Social Gatherings with Friends and Family: More than three times a week    Attends Religious Services: More than 4 times per year    Active Member of [employer] or Organizations: No    Attends Banker Meetings: Never    Marital Status: Married  Catering Manager Violence: Not At Risk (09/15/2023)   Humiliation, Afraid, Rape, and Kick questionnaire    Fear of Current or Ex-Partner: No    Emotionally Abused: No    Physically Abused: No    Sexually Abused: No   Family Status  Relation Name Status   Mother  Deceased       Cause of Death: COVID-19   Father  Deceased       73   Sister  Alive   Brother  Alive   Brother  Alive   Neg Hx  (Not Specified)  No partnership data on file   Family History  Problem Relation Age of Onset   Cancer Mother        Dementia & Schizophrenia   CAD Father    Colon cancer Neg Hx    Prostate cancer Neg Hx    No Known Allergies  Patient Care Team: Gasper Nancyann BRAVO, MD as PCP - General (Family Medicine) Wiley, Royden DASEN, DPM as Consulting Physician (Podiatry) System, Provider Not In [place], Thresa HERO, DPM as Consulting Physician (Podiatry) Nancyann Luetta SAUNDERS, MD (Physical Medicine and Rehabilitation) Maree Jannett POUR, MD as Consulting Physician (Neurology)   Medications: Outpatient Medications Prior to Visit  Medication Sig Note   amLODipine  (NORVASC ) 5 MG tablet TAKE 1  TABLET(5 MG) BY MOUTH DAILY    aspirin 81 MG tablet Take 81 mg by mouth daily.     gabapentin  (NEURONTIN ) 300 MG capsule TAKE 2 CAPSULES BY MOUTH THREE TIMES DAILY    hydrocortisone -pramoxine (ANALPRAM  HC) 2.5-1 % rectal cream Place 1 application rectally 3 (three) times daily.    ibuprofen (ADVIL) 800 MG tablet Take 800 mg by mouth every 6 (six) hours as needed.    lisinopril -hydrochlorothiazide  (ZESTORETIC ) 20-25 MG tablet TAKE 1 TABLET BY MOUTH EVERY DAY  metoprolol  succinate (TOPROL -XL) 50 MG 24 hr tablet TAKE 1 TABLET BY MOUTH EVERY DAY WITH OR IMMEDIATELY FOLLOWING A MEAL    Multiple Vitamin (MULTIVITAMIN) capsule Take 1 capsule by mouth daily.    omeprazole  (PRILOSEC) 40 MG capsule TAKE 1 CAPSULE BY MOUTH DAILY    rosuvastatin  (CRESTOR ) 20 MG tablet Take 1 tablet (20 mg total) by mouth daily.    tadalafil  (CIALIS ) 5 MG tablet TAKE 1 TO 2 TABLETS BY MOUTH EVERY DAY    [DISCONTINUED] neomycin -polymyxin-hydrocortisone  (CORTISPORIN) OTIC solution 3-4 drops both ears as neeeded    meloxicam  (MOBIC ) 15 MG tablet Take 1 tablet (15 mg total) by mouth daily. (Patient not taking: Reported on 04/27/2024)    [DISCONTINUED] gabapentin  (NEURONTIN ) 100 MG capsule Take 1 capsule (100 mg total) by mouth 3 (three) times daily. (Patient not taking: Reported on 04/27/2024) 04/27/2024: stopped a few years ago   [DISCONTINUED] methocarbamol  (ROBAXIN ) 500 MG tablet Take 500 mg by mouth every 6 (six) hours as needed for muscle spasms. (Patient not taking: Reported on 04/27/2024)    No facility-administered medications prior to visit.    Review of Systems  Constitutional:  Negative for appetite change, chills and fever.  Respiratory:  Negative for chest tightness, shortness of breath and wheezing.   Cardiovascular:  Negative for chest pain and palpitations.  Gastrointestinal:  Negative for abdominal pain, nausea and vomiting.      Objective    BP (!) 153/76   Pulse 66   Ht 5' 11 (1.803 m)   Wt 241 lb  11.2 oz (109.6 kg)   SpO2 99%   BMI 33.71 kg/m    Physical Exam  General Appearance:    Mildly obese male. Alert, cooperative, in no acute distress, appears stated age  Head:    Normocephalic, without obvious abnormality, atraumatic  Eyes:    PERRL, conjunctiva/corneas clear, EOM's intact, fundi    benign, both eyes       Ears:    Normal TM's and external ear canals, both ears  Nose:   Nares normal, septum midline, mucosa normal, no drainage   or sinus tenderness  Throat:   Lips, mucosa, and tongue normal; teeth and gums normal  Neck:   Supple, symmetrical, trachea midline, no adenopathy;       thyroid:  No enlargement/tenderness/nodules; no carotid   bruit or JVD  Back:     Symmetric, no curvature, ROM normal, no CVA tenderness  Lungs:     Clear to auscultation bilaterally, respirations unlabored  Chest wall:    No tenderness or deformity  Heart:    Normal heart rate. Normal rhythm. No murmurs, rubs, or gallops.  S1 and S2 normal  Abdomen:     Soft, non-tender, bowel sounds active all four quadrants,    no masses, no organomegaly  Genitalia:    deferred  Rectal:    deferred  Extremities:   All extremities are intact. No cyanosis or edema  Pulses:   2+ and symmetric all extremities  Skin:   Skin color, texture, turgor normal, no rashes or lesions  Lymph nodes:   Cervical, supraclavicular, and axillary nodes normal  Neurologic:   CNII-XII intact. Normal strength, sensation and reflexes      throughout       Last depression screening scores    09/15/2023    9:15 AM 03/17/2023    9:10 AM 09/05/2022    9:15 AM  PHQ 2/9 Scores  PHQ - 2 Score 0 0 0  PHQ-  9 Score 0 0 0   Last fall risk screening    03/17/2023    9:10 AM  Fall Risk   Falls in the past year? 0  Number falls in past yr: 0  Injury with Fall? 0   Last Audit-C alcohol use screening    09/15/2023    9:13 AM  Alcohol Use Disorder Test (AUDIT)  1. How often do you have a drink containing alcohol? 0  2. How  many drinks containing alcohol do you have on a typical day when you are drinking? 0  3. How often do you have six or more drinks on one occasion? 0  AUDIT-C Score 0   A score of 3 or more in women, and 4 or more in men indicates increased risk for alcohol abuse, EXCEPT if all of the points are from question 1     Assessment & Plan    Routine Health Maintenance and Physical Exam  Exercise Activities and Dietary recommendations  Goals   None     Immunization History  Administered Date(s) Administered   Influenza Split 06/07/2011   Influenza,inj,Quad PF,6-35 Mos 04/06/2020   Influenza-Unspecified 04/10/2017, 04/07/2018, 03/04/2024   Janssen (J&J) SARS-COV-2 Vaccination 08/29/2019, 04/18/2020   Moderna Sars-Covid-2 Vaccination 03/04/2021   PNEUMOCOCCAL CONJUGATE-20 03/07/2022   Pfizer(Comirnaty)Fall Seasonal Vaccine 12 years and older 03/16/2023, 03/12/2024   Respiratory Syncytial Virus Vaccine,Recomb Aduvanted(Arexvy) 06/09/2022   Td 10/30/2017   Tdap 09/21/2007   Zoster Recombinant(Shingrix ) 09/19/2023, 11/21/2023    Health Maintenance  Topic Date Due   COVID-19 Vaccine (6 - 2025-26 season) 09/09/2024   DTaP/Tdap/Td (3 - Td or Tdap) 10/31/2027   Colonoscopy  04/08/2032   Pneumococcal Vaccine: 50+ Years  Completed   Influenza Vaccine  Completed   Hepatitis C Screening  Completed   Zoster Vaccines- Shingrix   Completed   Meningococcal B Vaccine  Aged Out    Discussed health benefits of physical activity, and encouraged him to engage in regular exercise appropriate for his age and condition.   2. Primary hypertension Has not taken medications today. Encouraged to check home Bps one to times a week and let me know if stays above 140/90 - CBC - Comprehensive metabolic panel with GFR - Lipid panel - EKG 12-Lead  3. Hypogonadism, male Low testosterone  when checked at urology, but has not yet returned to repeat labs.  - TSH - Testosterone ,Free and Total  4.  Prediabetes  - Hemoglobin A1c  5. Hyperlipidemia, unspecified hyperlipidemia type He is tolerating rosuvastatin  well with no adverse effects.    6. Prostate cancer screening  - PSA Total (Reflex To Free)  7. Gastroesophageal reflux disease, unspecified whether esophagitis present Well controlled on current PPI  8. Lumbar foraminal stenosis   9. Chronic low back pain, unspecified back pain laterality, unspecified whether sciatica present  Continue current dose of gabapentin .   10. Left knee pain with ligament injury and bone spurs Chronic knee pain with ligament injury and bone spurs. Previous evaluation in [place], no surgery performed. - Refer to orthopedics in [facility] for further evaluation.  Future Appointments  Date Time Provider Department Center  12/26/2024 10:00 AM McGowan, Clotilda LABOR, PA-C [facility] None         Nancyann Perry, MD  [facility] [phone] (phone) [phone] (fax)  [facility]

## 2024-04-28 ENCOUNTER — Telehealth: Payer: Self-pay

## 2024-04-28 ENCOUNTER — Ambulatory Visit: Payer: Self-pay | Admitting: Family Medicine

## 2024-04-28 NOTE — Telephone Encounter (Signed)
 Called patient to get him scheduled not answer

## 2024-04-29 LAB — HEMOGLOBIN A1C
Est. average glucose Bld gHb Est-mCnc: 126 mg/dL
Hgb A1c MFr Bld: 6 % — ABNORMAL HIGH (ref 4.8–5.6)

## 2024-04-29 LAB — TESTOSTERONE,FREE AND TOTAL
Testosterone, Free: 4 pg/mL — AB (ref 6.6–18.1)
Testosterone: 116 ng/dL — ABNORMAL LOW (ref 264–916)

## 2024-04-29 LAB — CBC
Hematocrit: 42.7 % (ref 37.5–51.0)
Hemoglobin: 14 g/dL (ref 13.0–17.7)
MCH: 28.1 pg (ref 26.6–33.0)
MCHC: 32.8 g/dL (ref 31.5–35.7)
MCV: 86 fL (ref 79–97)
Platelets: 254 x10E3/uL (ref 150–450)
RBC: 4.98 x10E6/uL (ref 4.14–5.80)
RDW: 13.5 % (ref 11.6–15.4)
WBC: 10.3 x10E3/uL (ref 3.4–10.8)

## 2024-04-29 LAB — LIPID PANEL
Chol/HDL Ratio: 4 ratio (ref 0.0–5.0)
Cholesterol, Total: 206 mg/dL — ABNORMAL HIGH (ref 100–199)
HDL: 52 mg/dL (ref >39)
LDL Chol Calc (NIH): 131 mg/dL — ABNORMAL HIGH (ref 0–99)
Triglycerides: 128 mg/dL (ref 0–149)
VLDL Cholesterol Cal: 23 mg/dL (ref 5–40)

## 2024-04-29 LAB — PSA TOTAL (REFLEX TO FREE): Prostate Specific Ag, Serum: 0.2 ng/mL (ref 0.0–4.0)

## 2024-04-29 LAB — COMPREHENSIVE METABOLIC PANEL WITH GFR
ALT: 15 IU/L (ref 0–44)
AST: 19 IU/L (ref 0–40)
Albumin: 4.6 g/dL (ref 3.9–4.9)
Alkaline Phosphatase: 99 IU/L (ref 47–123)
BUN/Creatinine Ratio: 14 (ref 10–24)
BUN: 17 mg/dL (ref 8–27)
Bilirubin Total: 0.6 mg/dL (ref 0.0–1.2)
CO2: 25 mmol/L (ref 20–29)
Calcium: 9.9 mg/dL (ref 8.6–10.2)
Chloride: 98 mmol/L (ref 96–106)
Creatinine, Ser: 1.21 mg/dL (ref 0.76–1.27)
Globulin, Total: 2.7 g/dL (ref 1.5–4.5)
Glucose: 108 mg/dL — ABNORMAL HIGH (ref 70–99)
Potassium: 4.1 mmol/L (ref 3.5–5.2)
Sodium: 138 mmol/L (ref 134–144)
Total Protein: 7.3 g/dL (ref 6.0–8.5)
eGFR: 66 mL/min/1.73 (ref >59)

## 2024-04-29 LAB — TSH: TSH: 1.13 u[IU]/mL (ref 0.450–4.500)

## 2024-05-01 NOTE — Progress Notes (Unsigned)
 05/02/2024 9:20 PM   Keith Villegas Amel 1955-12-28 [id number]  Referring provider: Gasper Nancyann FORBES, MD [address] [place],  [place] [address]  Urological history: 1. ED -testosterone  level (04/2024) 116 - tadalafil  5 mg daily   2. BPH with LU TS -PSA (11/205) 0.2  HPI: Keith Villegas is a 68 y.o. male who presents today for low testosterone .   Previous records reviewed.     He has been experiencing reduced energy, reduced endurance, diminished work performance, diminished physical performance, loss of body hair, reduced beard growth, fatigue, reduced lean muscle mass and obesity.  He has also noticed depressive symptoms, cognitive dysfunction, reduced motivation, poor concentration, poor memory and irritability.  There is also been a decreased in his libido and issues with erectile dysfunction.  Testosterone  level (04/2024) 116, (12/2023) 214  Hemoglobin/hematocrit (04/2024) 14.0/42.7  Liver enzymes (04/2024) normal  I PSS ***  He reports sensation of incomplete bladder emptying,   urinary frequency,   urinary intermittency,   urinary urgency,   a weak urinary stream,   having to strain to void,   nocturia x ***,   leaking before being able to reach the restroom,   leaking with coughing,   leaking without awareness,   and post void dribbling.     He is wearing *** pads//depends  daily.    Patient denies any modifying or aggravating factors.  Patient denies any recent UTI's, gross hematuria, dysuria or suprapubic/flank pain.  Patient denies any fevers, chills, nausea or vomiting.  ***  He has a family history of PCa, colon cancer, ovarian cancer and/or breast cancer with ***.   He does not have a family history of PCa, colon cancer, ovarian cancer, and/or breast cancer .***     PSA (04/2024) 0.2  Serum creatinine (04/2024) 1.21, eGFR 66  Hemoglobin A1c (04/2024) 6.0  SHIM ***  He does not have confidence that he could get and keep an  erection, his erections are not firm enough for penetrative intercourse, he has difficulty maintaining his erections,  and he is not finding intercourse satisfactory for him.  ***  Patient still having spontaneous erections.  ***   He denies any pain or curvature with erections.    He is not able to ejaculate, has pain with ejaculation, and has blood in his ejaculate fluid.   ***  Cholesterol (04/2024) 206  TSH (04/2024) 1.130    PMH: Past Medical History:  Diagnosis Date   Adenomatous polyp of colon    Costochondritis 09/12/2015   GERD (gastroesophageal reflux disease)    Hypertension     Surgical History: Past Surgical History:  Procedure Laterality Date   APPENDECTOMY  1976   COLONOSCOPY WITH PROPOFOL  N/A 11/20/2016   Procedure: COLONOSCOPY WITH PROPOFOL ;  Surgeon: Therisa Bi, MD;  Location: [facility] ENDOSCOPY;  Service: Endoscopy;  Laterality: N/A;   COLONOSCOPY WITH PROPOFOL  N/A 04/08/2022   Procedure: COLONOSCOPY WITH PROPOFOL ;  Surgeon: Therisa Bi, MD;  Location: [facility] ENDOSCOPY;  Service: Gastroenterology;  Laterality: N/A;   echocardiogram  10/02/2010   Myocardial perfusion scan  10/02/2010   normal perfusion. Normal systolic function. Borderline right ventricular dilation. EF>65%   TOE SURGERY  2005    Home Medications:  Allergies as of 05/02/2024   No Known Allergies      Medication List        Accurate as of May 01, 2024  9:20 PM. If you have any questions, ask your nurse or doctor.  amLODipine  5 MG tablet Commonly known as: NORVASC  TAKE 1 TABLET(5 MG) BY MOUTH DAILY   aspirin 81 MG tablet Take 81 mg by mouth daily.   gabapentin  300 MG capsule Commonly known as: NEURONTIN  TAKE 2 CAPSULES BY MOUTH THREE TIMES DAILY   hydrocortisone -pramoxine 2.5-1 % rectal cream Commonly known as: Analpram  HC Place 1 application rectally 3 (three) times daily.   ibuprofen 800 MG tablet Commonly known as: ADVIL Take 800 mg by mouth every 6 (six)  hours as needed.   lisinopril -hydrochlorothiazide  20-25 MG tablet Commonly known as: ZESTORETIC  TAKE 1 TABLET BY MOUTH EVERY DAY   meloxicam  15 MG tablet Commonly known as: MOBIC  Take 1 tablet (15 mg total) by mouth daily.   metoprolol  succinate 50 MG 24 hr tablet Commonly known as: TOPROL -XL TAKE 1 TABLET BY MOUTH EVERY DAY WITH OR IMMEDIATELY FOLLOWING A MEAL   multivitamin capsule Take 1 capsule by mouth daily.   omeprazole  40 MG capsule Commonly known as: PRILOSEC TAKE 1 CAPSULE BY MOUTH DAILY   rosuvastatin  20 MG tablet Commonly known as: CRESTOR  Take 1 tablet (20 mg total) by mouth daily.   tadalafil  5 MG tablet Commonly known as: CIALIS  TAKE 1 TO 2 TABLETS BY MOUTH EVERY DAY        Allergies: No Known Allergies  Family History: Family History  Problem Relation Age of Onset   Cancer Mother        Dementia & Schizophrenia   CAD Father    Colon cancer Neg Hx    Prostate cancer Neg Hx     Social History:  reports that he quit smoking about 40 years ago. His smoking use included cigarettes. He has never used smokeless tobacco. He reports that he does not drink alcohol and does not use drugs.  ROS: Pertinent ROS in HPI  Physical Exam: There were no vitals taken for this visit.  Constitutional:  Well nourished. Alert and oriented, No acute distress. HEENT: [place] AT, moist mucus membranes.  Trachea midline, no masses. Cardiovascular: No clubbing, cyanosis, or edema. Respiratory: Normal respiratory effort, no increased work of breathing. GI: Abdomen is soft, non tender, non distended, no abdominal masses. Liver and spleen not palpable.  No hernias appreciated.  Stool sample for occult testing is not indicated.   GU: No CVA tenderness.  No bladder fullness or masses.  Patient with circumcised/uncircumcised phallus. ***Foreskin easily retracted***  Urethral meatus is patent.  No penile discharge. No penile lesions or rashes. Scrotum without lesions, cysts, rashes  and/or edema.  Testicles are located scrotally bilaterally. No masses are appreciated in the testicles. Left and right epididymis are normal. Rectal: Patient with  normal sphincter tone. Anus and perineum without scarring or rashes. No rectal masses are appreciated. Prostate is approximately *** grams, *** nodules are appreciated. Seminal vesicles are normal. Skin: No rashes, bruises or suspicious lesions. Lymph: No cervical or inguinal adenopathy. Neurologic: Grossly intact, no focal deficits, moving all 4 extremities. Psychiatric: Normal mood and affect.   Laboratory Data: See EPIC and HPI I have reviewed the labs.  Pertinent Imaging N/A   Assessment & Plan:    1. Hypogonadism - explained that the diagnosis of testosterone  deficiency/hypogonadism requires two morning testosterones at least two days apart below 300 to meet criteria which he has met  - explained that TRT is not a treatment for ED, he may see some improvement in his erections, but his ED will likely persist even with therapeutic levels of testosterone  - discussed potential side effects  of testosterone  replacement  including stimulation of erythrocytosis; edema; gynecomastia; worsening sleep apnea; venous thromboembolism; testicular atrophy and infertility.   The theoretical risk of growth stimulation of an undetected prostate cancer was also discussed.  He was informed that current evidence does not provide any definitive answers regarding the risks of testosterone  therapy on prostate cancer and cardiovascular disease. The need for periodic monitoring of his testosterone  level, PSA, hematocrit and DRE was discussed.  This monitoring will be conducted every three months during the first year of TRT and then every 6 months if blood work remains stable, if there is an abnormality found in follow up blood work, it will result in the monitoring of blood work more frequently  - advised that any missed or delayed appointments will also  result in delays in the refilling of the TRT as it is a controlled substance - advised that the office requires one week to refill TRT - Near castrate testosterone  levels*** - Significant symptoms***feeling depressed or tired, having little to no interest in sex, low energy and weak muscles or bones - Recommend starting TRT*** - We discussed the most common forms of replacement including intramuscular injection and gels and he desires to start injections*** - Rx testosterone  cypionate-200 mg every 2 weeks to start*** - Appointment will be made for injection training*** - Follow-up 5 weeks after starting TRT for testosterone  level and symptom check*** - discussed that we follow guidelines for age appropriate testosterone  levels to decide on dosing regimens for TRT and this is based on current agreements in the medical community and we will not deviate from this unless there is good scientific data to do so; [phone] [phone]) (3.7-18.9)  2. ED - Continue tadalafil  5 mg 1 to 2 tablets daily  3. BPH with LU TS - Minimal bother symptoms of urge incontinence  No follow-ups on file.  These notes generated with voice recognition software. I apologize for typographical errors.  CLOTILDA HELON RIGGERS  [facility] [address]  [address] [place], [place] [address] [phone]

## 2024-05-02 ENCOUNTER — Other Ambulatory Visit: Payer: Self-pay

## 2024-05-02 ENCOUNTER — Other Ambulatory Visit
Admission: RE | Admit: 2024-05-02 | Discharge: 2024-05-02 | Disposition: A | Discharge status: Home / Self Care | Attending: Urology | Admitting: Urology

## 2024-05-02 ENCOUNTER — Ambulatory Visit (INDEPENDENT_AMBULATORY_CARE_PROVIDER_SITE_OTHER): Admitting: Urology

## 2024-05-02 ENCOUNTER — Encounter: Payer: Self-pay | Admitting: Urology

## 2024-05-02 VITALS — BP 174/79 | HR 69 | Wt 241.0 lb

## 2024-05-02 DIAGNOSIS — N401 Enlarged prostate with lower urinary tract symptoms: Secondary | ICD-10-CM

## 2024-05-02 DIAGNOSIS — E291 Testicular hypofunction: Secondary | ICD-10-CM

## 2024-05-02 DIAGNOSIS — N138 Other obstructive and reflux uropathy: Secondary | ICD-10-CM

## 2024-05-02 DIAGNOSIS — N529 Male erectile dysfunction, unspecified: Secondary | ICD-10-CM | POA: Diagnosis not present

## 2024-05-02 MED ORDER — TESTOSTERONE 20.25 MG/1.25GM (1.62%) TD GEL: 2.0000 | Freq: Every day | TRANSDERMAL | 1 refills | Status: DC

## 2024-05-02 NOTE — Addendum Note (Signed)
 Addended by: Natsumi Whitsitt A on: 05/02/2024 03:56 PM   Modules accepted: Orders

## 2024-05-04 ENCOUNTER — Ambulatory Visit: Payer: Self-pay | Admitting: Urology

## 2024-05-04 LAB — PROLACTIN: Prolactin: 13.6 ng/mL (ref 3.6–25.2)

## 2024-05-04 LAB — FSH/LH
FSH: 4.3 m[IU]/mL (ref 1.5–12.4)
LH: 6.1 m[IU]/mL (ref 1.7–8.6)

## 2024-05-05 NOTE — Progress Notes (Signed)
 Called patient and no answer left message to call back

## 2024-05-09 NOTE — Telephone Encounter (Signed)
 Pt called office and I read message from [facility] to him.  He said pharmacy told him to contact us , not sure if he may need a PA or not.  His pharmacy is [facility] in [place] and he has [employer].

## 2024-05-10 ENCOUNTER — Encounter: Payer: Self-pay | Admitting: Urology

## 2024-05-16 NOTE — Addendum Note (Signed)
 Addended by: GASPER GOLAS E on: 05/16/2024 12:04 PM   Modules accepted: Orders

## 2024-05-30 ENCOUNTER — Telehealth: Payer: Self-pay | Admitting: Urology

## 2024-05-30 NOTE — Telephone Encounter (Signed)
 Ignore previous note.  He has had his second am testosterone  level already.

## 2024-05-30 NOTE — Telephone Encounter (Signed)
 Keith Villegas will need another morning testosterone  before his appointment next week before 10 am.  Will you get him scheduled for that lab visit?

## 2024-05-30 NOTE — Progress Notes (Deleted)
 06/06/2024 7:30 PM   [name] 1955/11/06 [id number]  Referring provider: Gasper Nancyann FORBES, MD [address] [place],  [place] [address]  Urological history: 1. ED -testosterone  level (04/2024) 116; second testosterone  pending  - tadalafil  5 mg daily   2. BPH with LU TS -PSA (11/205) 0.2  HPI: Keith Villegas is a 68 y.o. male who presents today one month follow up after starting AndroGel  for hypogonadism.    Previous records reviewed.     He reports good adherence to AndroGel  1.62%, 2 pumps daily.  Denies new complaints of low libido, erectile dysfunction, fatigue, or mood changes.  No complaints of gynecomastia, visual changes, or thromboembolic symptoms.  Energy level, libido and overall sense of wellbeing being reported as stable/ improved compared to prior visit.    Testosterone  level pending   Hemoglobin/hematocrit (04/2024) 14.0/42.7  Liver enzymes (04/2024) normal  I PSS 1/3  He reports nocturia x 1.  He on a rare occasion will have leaking before being able to reach the restroom, just a few drops.   Patient denies any modifying or aggravating factors.  Patient denies any recent UTI's, gross hematuria, dysuria or suprapubic/flank pain.  Patient denies any fevers, chills, nausea or vomiting.    SHIM 12  He does not have confidence that he could get and keep an erection, his erections are not firm enough for penetrative intercourse, he has difficulty maintaining his erections,  and he is not finding intercourse satisfactory for him.    Patient is not still having spontaneous erections.  He denies any pain or curvature with erections.    Cholesterol (04/2024) 206  TSH (04/2024) 1.130    PMH: Past Medical History:  Diagnosis Date   Adenomatous polyp of colon    Costochondritis 09/12/2015   GERD (gastroesophageal reflux disease)    Hypertension     Surgical History: Past Surgical History:  Procedure Laterality Date   APPENDECTOMY  1976    COLONOSCOPY WITH PROPOFOL  N/A 11/20/2016   Procedure: COLONOSCOPY WITH PROPOFOL ;  Surgeon: Therisa Bi, MD;  Location: [facility] ENDOSCOPY;  Service: Endoscopy;  Laterality: N/A;   COLONOSCOPY WITH PROPOFOL  N/A 04/08/2022   Procedure: COLONOSCOPY WITH PROPOFOL ;  Surgeon: Therisa Bi, MD;  Location: [facility] ENDOSCOPY;  Service: Gastroenterology;  Laterality: N/A;   echocardiogram  10/02/2010   Myocardial perfusion scan  10/02/2010   normal perfusion. Normal systolic function. Borderline right ventricular dilation. EF>65%   TOE SURGERY  2005    Home Medications:  Allergies as of 06/06/2024   No Known Allergies      Medication List        Accurate as of May 30, 2024  7:30 PM. If you have any questions, ask your nurse or doctor.          amLODipine  5 MG tablet Commonly known as: NORVASC  TAKE 1 TABLET(5 MG) BY MOUTH DAILY   aspirin 81 MG tablet Take 81 mg by mouth daily.   gabapentin  300 MG capsule Commonly known as: NEURONTIN  TAKE 2 CAPSULES BY MOUTH THREE TIMES DAILY   hydrocortisone -pramoxine 2.5-1 % rectal cream Commonly known as: Analpram  HC Place 1 application rectally 3 (three) times daily.   ibuprofen 800 MG tablet Commonly known as: ADVIL Take 800 mg by mouth every 6 (six) hours as needed.   lisinopril -hydrochlorothiazide  20-25 MG tablet Commonly known as: ZESTORETIC  TAKE 1 TABLET BY MOUTH EVERY DAY   meloxicam  15 MG tablet Commonly known as: MOBIC  Take 1 tablet (15 mg total)  by mouth daily.   metoprolol  succinate 50 MG 24 hr tablet Commonly known as: TOPROL -XL TAKE 1 TABLET BY MOUTH EVERY DAY WITH OR IMMEDIATELY FOLLOWING A MEAL   multivitamin capsule Take 1 capsule by mouth daily.   omeprazole  40 MG capsule Commonly known as: PRILOSEC TAKE 1 CAPSULE BY MOUTH DAILY   rosuvastatin  20 MG tablet Commonly known as: CRESTOR  Take 1 tablet (20 mg total) by mouth daily.   tadalafil  5 MG tablet Commonly known as: CIALIS  TAKE 1 TO 2 TABLETS BY MOUTH EVERY  DAY   Testosterone  20.25 MG/1.25GM (1.62%) Gel Commonly known as: AndroGel  Apply 2 Pump topically daily.        Allergies: No Known Allergies  Family History: Family History  Problem Relation Age of Onset   Cancer Mother        Dementia & Schizophrenia   CAD Father    Colon cancer Neg Hx    Prostate cancer Neg Hx     Social History:  reports that he quit smoking about 40 years ago. His smoking use included cigarettes. He has never used smokeless tobacco. He reports that he does not drink alcohol and does not use drugs.  ROS: Pertinent ROS in HPI  Physical Exam: There were no vitals taken for this visit.  Constitutional:  Well nourished. Alert and oriented, No acute distress. HEENT: [place] AT, moist mucus membranes.  Trachea midline, no masses. Cardiovascular: No clubbing, cyanosis, or edema. Respiratory: Normal respiratory effort, no increased work of breathing. GI: Abdomen is soft, non tender, non distended, no abdominal masses. Liver and spleen not palpable.  No hernias appreciated.  Stool sample for occult testing is not indicated.   GU: No CVA tenderness.  No bladder fullness or masses.  Patient with circumcised/uncircumcised phallus. ***Foreskin easily retracted***  Urethral meatus is patent.  No penile discharge. No penile lesions or rashes. Scrotum without lesions, cysts, rashes and/or edema.  Testicles are located scrotally bilaterally. No masses are appreciated in the testicles. Left and right epididymis are normal. Rectal: Patient with  normal sphincter tone. Anus and perineum without scarring or rashes. No rectal masses are appreciated. Prostate is approximately *** grams, *** nodules are appreciated. Seminal vesicles are normal. Skin: No rashes, bruises or suspicious lesions. Lymph: No cervical or inguinal adenopathy. Neurologic: Grossly intact, no focal deficits, moving all 4 extremities. Psychiatric: Normal mood and affect.   Laboratory Data: See EPIC and HPI I  have reviewed the labs.  Pertinent Imaging N/A   Assessment & Plan:    1. Hypogonadism -  testosterone  level pending - continue testosterone  1.6 % gel, 2 pumps daily  2. ED - Continue tadalafil  5 mg 1 to 2 tablets daily  3. BPH with LU TS - Minimal bother symptoms of urge incontinence  No follow-ups on file.  These notes generated with voice recognition software. I apologize for typographical errors.  CLOTILDA HELON RIGGERS  [facility] [address]  [address] [place], [place] [address] [phone]

## 2024-06-06 ENCOUNTER — Ambulatory Visit: Admitting: Urology

## 2024-06-06 DIAGNOSIS — E291 Testicular hypofunction: Secondary | ICD-10-CM

## 2024-06-06 DIAGNOSIS — N529 Male erectile dysfunction, unspecified: Secondary | ICD-10-CM

## 2024-06-16 ENCOUNTER — Other Ambulatory Visit: Payer: Self-pay | Admitting: Family Medicine

## 2024-06-16 DIAGNOSIS — K219 Gastro-esophageal reflux disease without esophagitis: Secondary | ICD-10-CM

## 2024-06-27 NOTE — Progress Notes (Signed)
 "    06/28/2024 7:47 PM   Keith Villegas Amel [birth year] [id number]  Referring provider: Gasper Nancyann FORBES, MD [address] [place],  [place] [address]  Urological history: 1. ED -testosterone  level 214 (12/2023) and 116 (04/2024)  - tadalafil  5 mg daily   2. BPH with LU TS -PSA (11/205) 0.2  HPI: Keith Villegas is a 69 y.o. male who presents today one month follow up after starting AndroGel  for hypogonadism.    Previous records reviewed.     He has not been able to fill his AndroGel  1.62%, 2 pumps daily.    Testosterone  level pending   Hemoglobin/hematocrit (04/2024) 14.0/42.7  Liver enzymes (04/2024) normal  I PSS 1/3  He reports nocturia x 1.  He on a rare occasion will have leaking before being able to reach the restroom, just a few drops.   Patient denies any modifying or aggravating factors.  Patient denies any recent UTI's, gross hematuria, dysuria or suprapubic/flank pain.  Patient denies any fevers, chills, nausea or vomiting.    SHIM 12  He does not have confidence that he could get and keep an erection, his erections are not firm enough for penetrative intercourse, he has difficulty maintaining his erections,  and he is not finding intercourse satisfactory for him.    Patient is not still having spontaneous erections.  He denies any pain or curvature with erections.    Cholesterol (04/2024) 206  TSH (04/2024) 1.130    PMH: Past Medical History:  Diagnosis Date   Adenomatous polyp of colon    Costochondritis 09/12/2015   GERD (gastroesophageal reflux disease)    Hypertension     Surgical History: Past Surgical History:  Procedure Laterality Date   APPENDECTOMY  1976   COLONOSCOPY WITH PROPOFOL  N/A 11/20/2016   Procedure: COLONOSCOPY WITH PROPOFOL ;  Surgeon: Therisa Bi, MD;  Location: [facility] ENDOSCOPY;  Service: Endoscopy;  Laterality: N/A;   COLONOSCOPY WITH PROPOFOL  N/A 04/08/2022   Procedure: COLONOSCOPY WITH PROPOFOL ;  Surgeon: Therisa Bi,  MD;  Location: [facility] ENDOSCOPY;  Service: Gastroenterology;  Laterality: N/A;   echocardiogram  10/02/2010   Myocardial perfusion scan  10/02/2010   normal perfusion. Normal systolic function. Borderline right ventricular dilation. EF>65%   TOE SURGERY  2005    Home Medications:  Allergies as of 06/28/2024   No Known Allergies      Medication List        Accurate as of June 28, 2024 11:59 PM. If you have any questions, ask your nurse or doctor.          amLODipine  5 MG tablet Commonly known as: NORVASC  TAKE 1 TABLET(5 MG) BY MOUTH DAILY   aspirin 81 MG tablet Take 81 mg by mouth daily.   gabapentin  300 MG capsule Commonly known as: NEURONTIN  TAKE 2 CAPSULES BY MOUTH THREE TIMES DAILY   hydrocortisone -pramoxine 2.5-1 % rectal cream Commonly known as: Analpram  HC Place 1 application rectally 3 (three) times daily.   ibuprofen 800 MG tablet Commonly known as: ADVIL Take 800 mg by mouth every 6 (six) hours as needed.   lisinopril -hydrochlorothiazide  20-25 MG tablet Commonly known as: ZESTORETIC  TAKE 1 TABLET BY MOUTH EVERY DAY   meloxicam  15 MG tablet Commonly known as: MOBIC  Take 1 tablet (15 mg total) by mouth daily.   metoprolol  succinate 50 MG 24 hr tablet Commonly known as: TOPROL -XL TAKE 1 TABLET BY MOUTH EVERY DAY WITH OR IMMEDIATELY FOLLOWING A MEAL   multivitamin capsule Take 1 capsule by  mouth daily.   omeprazole  40 MG capsule Commonly known as: PRILOSEC TAKE 1 CAPSULE BY MOUTH DAILY   rosuvastatin  20 MG tablet Commonly known as: CRESTOR  Take 1 tablet (20 mg total) by mouth daily.   tadalafil  5 MG tablet Commonly known as: CIALIS  TAKE 1 TO 2 TABLETS BY MOUTH EVERY DAY   Testosterone  20.25 MG/1.25GM (1.62%) Gel Commonly known as: AndroGel  Apply 2 Pump topically daily.        Allergies: No Known Allergies  Family History: Family History  Problem Relation Age of Onset   Cancer Mother        Dementia & Schizophrenia   CAD Father     Colon cancer Neg Hx    Prostate cancer Neg Hx     Social History:  reports that he quit smoking about 41 years ago. His smoking use included cigarettes. He has never used smokeless tobacco. He reports that he does not drink alcohol and does not use drugs.  ROS: Pertinent ROS in HPI  Physical Exam: BP (!) 151/82   Pulse 86   Wt 235 lb (106.6 kg)   SpO2 96%   BMI 32.78 kg/m   Constitutional:  Well nourished. Alert and oriented, No acute distress. HEENT: [place] AT, moist mucus membranes.  Trachea midline Cardiovascular: No clubbing, cyanosis, or edema. Respiratory: Normal respiratory effort, no increased work of breathing. Neurologic: Grossly intact, no focal deficits, moving all 4 extremities. Psychiatric: Normal mood and affect.   Laboratory Data: See EPIC and HPI I have reviewed the labs.  Pertinent Imaging N/A   Assessment & Plan:    1. Hypogonadism -  testosterone  level pending - I have sent in a new prescription for testosterone  1.6 % gel, 2 pumps daily - I contacted the pharmacy and they are out of stock for the moment   2. ED - Continue tadalafil  5 mg 1 to 2 tablets daily - given a refill   3. BPH with LU TS - Minimal bother symptoms of urge incontinence  Return for will schedule a repeat testosterone  after apply gel for 30 days .  These notes generated with voice recognition software. I apologize for typographical errors.  Keith Villegas  [facility] [address]  [address] [place], [place] [address] [phone]  "

## 2024-06-28 ENCOUNTER — Ambulatory Visit: Admitting: Urology

## 2024-06-28 VITALS — BP 151/82 | HR 86 | Wt 235.0 lb

## 2024-06-28 DIAGNOSIS — E291 Testicular hypofunction: Secondary | ICD-10-CM

## 2024-06-28 DIAGNOSIS — N138 Other obstructive and reflux uropathy: Secondary | ICD-10-CM | POA: Diagnosis not present

## 2024-06-28 DIAGNOSIS — N401 Enlarged prostate with lower urinary tract symptoms: Secondary | ICD-10-CM

## 2024-06-28 DIAGNOSIS — N529 Male erectile dysfunction, unspecified: Secondary | ICD-10-CM | POA: Diagnosis not present

## 2024-06-28 MED ORDER — TESTOSTERONE 20.25 MG/1.25GM (1.62%) TD GEL: 2.0000 | Freq: Every day | TRANSDERMAL | 1 refills | Status: AC

## 2024-06-28 MED ORDER — TADALAFIL 5 MG PO TABS: ORAL_TABLET | ORAL | 0 refills | Status: AC

## 2024-06-29 LAB — TESTOSTERONE: Testosterone: 82 ng/dL — ABNORMAL LOW (ref 264–916)

## 2024-07-03 ENCOUNTER — Telehealth: Payer: Self-pay | Admitting: Urology

## 2024-07-03 ENCOUNTER — Encounter: Payer: Self-pay | Admitting: Urology

## 2024-07-03 NOTE — Telephone Encounter (Signed)
 Would you see if Keith Villegas was able to get his testosterone  gel and if so, we need to schedule a follow up in 30 days for a testosterone  level.  If he wasn't able to get the gel, please find out why.

## 2024-07-07 NOTE — Telephone Encounter (Signed)
 Dear Keith Villegas: [facility]  received a request from your provider for coverage of Testosterone  Transdermal Gel. As long as you remain covered by the [facility] and there are no changes to your plan benefits, this request is approved for the following time period: 07/06/2024 - 07/06/2027 Approvals may be subject to dosing limits in accordance with FDA approved labeling, evidence-based practice guidelines or your pharmacy plan benefits. If you have not already done so, you may ask your pharmacist to fill the prescription. If you have questions, please call Customer Care toll-free at the number on your [facility] Plan ID card toll-free at [phone]. Sincerely, [facility] cc: Dr. CLOTILDA CORNWALL

## 2024-07-07 NOTE — Telephone Encounter (Signed)
 I let  patient know that the testosterone  gel  was covered

## 2024-10-24 ENCOUNTER — Ambulatory Visit: Admitting: Family Medicine

## 2024-12-26 ENCOUNTER — Ambulatory Visit: Admitting: Urology
